# Patient Record
Sex: Female | Born: 1950 | Race: White | Hispanic: No | Marital: Married | Smoking: Never smoker
Health system: Southern US, Community
[De-identification: ages and names within clinical notes are randomized; demographics above are authoritative.]

---

## 1998-12-10 ENCOUNTER — Other Ambulatory Visit: Admission: RE | Admit: 1998-12-10 | Discharge: 1998-12-10 | Payer: Self-pay | Admitting: Obstetrics & Gynecology

## 1999-12-14 ENCOUNTER — Other Ambulatory Visit: Admission: RE | Admit: 1999-12-14 | Discharge: 1999-12-14 | Payer: Self-pay | Admitting: Obstetrics & Gynecology

## 2001-05-12 ENCOUNTER — Other Ambulatory Visit: Admission: RE | Admit: 2001-05-12 | Discharge: 2001-05-12 | Payer: Self-pay | Admitting: Obstetrics & Gynecology

## 2002-06-13 ENCOUNTER — Other Ambulatory Visit: Admission: RE | Admit: 2002-06-13 | Discharge: 2002-06-13 | Payer: Self-pay | Admitting: Obstetrics & Gynecology

## 2003-12-25 ENCOUNTER — Other Ambulatory Visit: Admission: RE | Admit: 2003-12-25 | Discharge: 2003-12-25 | Payer: Self-pay | Admitting: Obstetrics & Gynecology

## 2004-05-05 ENCOUNTER — Other Ambulatory Visit: Admission: RE | Admit: 2004-05-05 | Discharge: 2004-05-05 | Payer: Self-pay | Admitting: Obstetrics & Gynecology

## 2005-01-01 ENCOUNTER — Other Ambulatory Visit: Admission: RE | Admit: 2005-01-01 | Discharge: 2005-01-01 | Payer: Self-pay | Admitting: Obstetrics & Gynecology

## 2007-10-24 ENCOUNTER — Ambulatory Visit: Payer: Self-pay | Admitting: Cardiology

## 2007-11-01 ENCOUNTER — Encounter: Payer: Self-pay | Admitting: Cardiology

## 2007-11-01 ENCOUNTER — Ambulatory Visit: Payer: Self-pay

## 2007-11-01 ENCOUNTER — Ambulatory Visit: Payer: Self-pay | Admitting: Internal Medicine

## 2010-01-12 ENCOUNTER — Ambulatory Visit (HOSPITAL_COMMUNITY): Admission: RE | Admit: 2010-01-12 | Discharge: 2010-01-12 | Payer: Self-pay | Admitting: Family Medicine

## 2010-02-25 ENCOUNTER — Ambulatory Visit (HOSPITAL_COMMUNITY): Admission: RE | Admit: 2010-02-25 | Discharge: 2010-02-25 | Payer: Self-pay | Admitting: General Surgery

## 2010-09-11 LAB — CBC
HCT: 39.8 % (ref 36.0–46.0)
MCH: 31 pg (ref 26.0–34.0)
MCHC: 33.1 g/dL (ref 30.0–36.0)
MCV: 93.7 fL (ref 78.0–100.0)
RBC: 4.25 MIL/uL (ref 3.87–5.11)
WBC: 7.5 10*3/uL (ref 4.0–10.5)

## 2010-09-11 LAB — BASIC METABOLIC PANEL
Chloride: 104 mEq/L (ref 96–112)
Creatinine, Ser: 0.71 mg/dL (ref 0.4–1.2)
GFR calc Af Amer: >60 mL/min (ref >60)
GFR calc non Af Amer: >60 mL/min (ref >60)
Potassium: 4 mEq/L (ref 3.5–5.1)
Sodium: 138 mEq/L (ref 135–145)

## 2010-11-10 NOTE — Letter (Signed)
October 24, 2007    Gerrit Friends. Aldona Bar, M.D.  [address]  [place], [place] [address]   RE:  Karen, Callahan  MRN:  [id number]  /  DOB:  01-31-1951   Dear Dr. Karma Greaser:   Thank you for your referral of Karen Callahan.  She he is a pleasant 60-  year-old woman with a history of longstanding hypothyroidism on  Synthroid replacement.  She was noted to have skips and a somewhat  irregular heart beat which was only minimally symptomatic in discussing  things with the patient.  It was felt that she was likely having  premature ventricular complexes which seems very likely, and I note that  her TSH level was found to be low.  Her Synthroid has been subsequently  adjusted.  She is not reporting any major sense of palpitations or  exertional chest pain, and she has not had any problems with dizziness  or syncope.  In exploring things a bit further, she does state that she  has had a fairly chronic mild cough, although no frank wheezing.  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  She states that when she  walks up a flight of steps, she does have shortness of breath that is  somewhat delayed and causes of her to rest for at least a minute.  Her  resting electrocardiogram is normal.  She denies ever having any prior  diagnosis of bronchospastic disease or asthma.  Otherwise, she states  that when she exercises regularly on a treadmill or using weight  machines, she does not have any exertional chest discomfort or marked  breathlessness.  Overall, she does not seem to be very concerned about  her symptoms.  In reviewing her recent laboratory data, her TSH was  0.616 after Synthroid adjustment, BUN 17, creatinine 0.7, potassium of  4.1. Hemoglobin 13.8.   ALLERGIES:  Include PENICILLIN, SULFA DRUGS, ADVIL and MACROBID.   PRESENT MEDICATIONS:  Include Synthroid 75 mcg daily.   PAST MEDICAL HISTORY:  In  addition to that reviewed above, includes  previous history of bladder and throat cyst removal and also  endometriosis.   SOCIAL HISTORY:  The patient is married.  She has one child, a daughter  in her late 61s.  She had a son die at age 81.  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  She denies any significant  tobacco use or significant alcohol use.  She does drink caffeinated  beverages regularly, approximately 2-3 a week.   FAMILY HISTORY:  Is noncontributory for premature cardiovascular disease  or sudden cardiac death.   REVIEW OF SYSTEMS:  As outlined above.  This is essentially negative  otherwise except for seasonal allergies.   PHYSICAL EXAMINATION:  VITAL SIGNS:  Blood pressure is 134/70, heart  rate 68. Weight is 137 pounds.  GENERAL:  This is a normally nourished woman in no acute distress.  HEENT:  Conjunctiva and lids normal.  Oropharynx is clear.  NECK:  Supple.  No elevated jugular venous pressure, no loud bruits.  LUNGS:  Clear without labored breathing.  CARDIAC:  Exam reveals a regular rate and rhythm.  No S3 gallop.  No  extra heart sounds.  No loud murmur or pericardial rub.  ABDOMEN:  Without bruits.  EXTREMITIES:  Exhibit no pitting edema.  Distal pulses are 2+.  SKIN:  Otherwise warm and dry.  MUSCULOSKELETAL:  No kyphosis noted.  NEUROPSYCHIATRIC:  The patient is alert and oriented x3.  Affect seems  appropriate.   IMPRESSION AND RECOMMENDATIONS:  Description of occasional skipped  heart beats and some flutters that are brief and do not limited  activity.  Furthermore, there is no associated dizziness or syncope, and  she is not reporting any exertional chest pain.  I suspect these  palpitations are rather benign.  She may well be having premature  ventricular complexes based on their description, and I agree with your  adjustment in her Synthroid regimen.  We also talked about reducing  caffeine and other possible avenues for  testing versus observation.  After reviewing the matter, our plan is to evaluate left ventricular  function with an echocardiogram.  If her ejection fraction is normal and  she has no major valvular abnormalities, this would suggest an overall  benign process.  She is not describing symptoms particularly worrisome  for ischemia,  although I do wonder about the possibility of reactive airways disease,  possibly exercise-induced.  To this end, we will refer her for pulmonary  function tests with bronchodilator challenge to clarify the matter.  Otherwise, we will plan to contact her with the results.  If there is  further evaluation necessary, this can be arranged.    Sincerely,      Jonelle Sidle, MD  Electronically Signed    SGM/MedQ  DD: 10/24/2007  DT: 10/24/2007  Job #: [id number]

## 2010-12-11 ENCOUNTER — Ambulatory Visit: Payer: Self-pay | Admitting: Cardiology

## 2012-09-15 ENCOUNTER — Encounter: Payer: Self-pay | Admitting: *Deleted

## 2012-09-15 DIAGNOSIS — E038 Other specified hypothyroidism: Secondary | ICD-10-CM

## 2012-09-18 ENCOUNTER — Ambulatory Visit (INDEPENDENT_AMBULATORY_CARE_PROVIDER_SITE_OTHER): Payer: BC Managed Care – PPO | Admitting: Nurse Practitioner

## 2012-09-18 ENCOUNTER — Encounter: Payer: Self-pay | Admitting: Nurse Practitioner

## 2012-09-18 VITALS — BP 122/78 | Temp 98.6°F | Wt 143.8 lb

## 2012-09-18 DIAGNOSIS — E039 Hypothyroidism, unspecified: Secondary | ICD-10-CM | POA: Insufficient documentation

## 2012-09-18 DIAGNOSIS — R300 Dysuria: Secondary | ICD-10-CM

## 2012-09-18 DIAGNOSIS — Z780 Asymptomatic menopausal state: Secondary | ICD-10-CM

## 2012-09-18 LAB — POCT URINALYSIS DIPSTICK
Glucose, UA: NEGATIVE
Spec Grav, UA: 1.01

## 2012-09-19 DIAGNOSIS — Z780 Asymptomatic menopausal state: Secondary | ICD-10-CM | POA: Insufficient documentation

## 2012-09-19 DIAGNOSIS — R300 Dysuria: Secondary | ICD-10-CM | POA: Insufficient documentation

## 2012-09-19 DIAGNOSIS — N390 Urinary tract infection, site not specified: Secondary | ICD-10-CM | POA: Insufficient documentation

## 2012-09-19 NOTE — Assessment & Plan Note (Signed)
Assessment: Postmenopausal/hypoestrogenism Plan: Discussed risk associated with unopposed estrogen use. Recommended patient consider progesterone cream. Patient to discuss with her gynecologist. Complete Monistat as directed. Also continue use of Replens.

## 2012-09-19 NOTE — Assessment & Plan Note (Signed)
Assessment: Dysuria rule out UTI Plan: Will hold on antibiotics at this point. Urine sent for culture. Patient to call our office or her urologist if symptoms worsen.

## 2012-09-19 NOTE — Progress Notes (Signed)
Objective: Patient presents with complaints of slight burning with urination. Has a complex urologic history. Being followed by Dr. Wilson Singer for urology. 62 years old. Went through menopause about 4 years ago. Gets regular followup with her gynecologist. Rare use of Estrace cream. Was on a combination estrogen/progesterone HRT until one month ago. Has a followup appointment with urologist in a few months. May need cystoscopy for possible IC. Was on trimethoprim from November to February. Was on Levaquin 500 mg a day for 7 days, finish this 3 days ago. Continues to have some burning in the pelvic area. Does have a history of hypoestrogenism. No discharge. Has been using Monistat for 5 days which has helped some. Minimal pelvic discomfort. No back or flank pain. No fever. Some frequency. Married, same sexual partner. Note her mother had breast cancer at age 41, positive for estrogen receptors. Symptoms are not as severe as previously with her UTIs. Objective: NAD. Alert, oriented. Lungs clear. Heart regular rate rhythm. No CVA or flank tenderness. Abdomen soft nondistended with minimal suprapubic area tenderness. Urine is extremely dilute, urine microscopic negative. Patient defers pelvic exam.

## 2012-09-20 LAB — URINE CULTURE: Organism ID, Bacteria: NO GROWTH

## 2012-09-20 NOTE — Progress Notes (Signed)
Pt notified of results

## 2012-12-25 ENCOUNTER — Encounter: Payer: Self-pay | Admitting: Family Medicine

## 2012-12-25 ENCOUNTER — Ambulatory Visit (INDEPENDENT_AMBULATORY_CARE_PROVIDER_SITE_OTHER): Payer: BC Managed Care – PPO | Admitting: Family Medicine

## 2012-12-25 VITALS — BP 136/84 | Temp 99.2°F | Wt 143.0 lb

## 2012-12-25 DIAGNOSIS — R300 Dysuria: Secondary | ICD-10-CM

## 2012-12-25 DIAGNOSIS — N3010 Interstitial cystitis (chronic) without hematuria: Secondary | ICD-10-CM | POA: Insufficient documentation

## 2012-12-25 LAB — POCT URINALYSIS DIPSTICK

## 2012-12-25 NOTE — Progress Notes (Signed)
  Subjective:    Patient ID: Karen Callahan, female    DOB: 03-18-1951, 62 y.o.   MRN: [id number]  Urinary Tract Infection  This is a new problem. The current episode started in the past 7 days. The problem occurs every urination. The problem has been gradually worsening. The quality of the pain is described as burning. The pain is at a severity of 5/10. The pain is moderate. There has been no fever. The fever has been present for 1 - 2 days. Pertinent negatives include no chills. She has tried nothing for the symptoms. The treatment provided mild relief.  coffee and citiric acid flares it up. No obs fever. No nausea. Increased pressure. urogesic blue--calmed down symptoms Stress level calm not under a lot. Review of Systems  Constitutional: Negative for chills.  non Otherwise negative    Objective:   Physical Exam  Alert no acute distress. Lungs clear. Heart regular rate rhythm. No lumbar tenderness. No true CVA tenderness.  Urinalysis rare white blood cell no bacteria no red blood cells    Assessment & Plan:  Impression probable flare of interstitial cystitis discussed with patient. With many frequent UTIs in past patient request a culture which I think is reasonable. Culture urine. Symptomatic care discussed. WSL

## 2012-12-27 LAB — URINE CULTURE

## 2012-12-28 MED ORDER — CIPROFLOXACIN HCL 250 MG PO TABS: 250.0000 mg | ORAL_TABLET | Freq: Two times a day (BID) | ORAL | Status: AC

## 2012-12-28 NOTE — Addendum Note (Signed)
Addended by: Margaretha Sheffield on: 12/28/2012 10:55 AM   Modules accepted: Orders

## 2013-01-09 ENCOUNTER — Other Ambulatory Visit: Payer: Self-pay | Admitting: Family Medicine

## 2013-03-02 ENCOUNTER — Encounter: Payer: Self-pay | Admitting: Family Medicine

## 2013-03-02 ENCOUNTER — Ambulatory Visit (INDEPENDENT_AMBULATORY_CARE_PROVIDER_SITE_OTHER): Payer: BC Managed Care – PPO | Admitting: Family Medicine

## 2013-03-02 VITALS — BP 120/76 | Ht 65.0 in | Wt 142.0 lb

## 2013-03-02 DIAGNOSIS — F411 Generalized anxiety disorder: Secondary | ICD-10-CM

## 2013-03-02 DIAGNOSIS — E038 Other specified hypothyroidism: Secondary | ICD-10-CM

## 2013-03-02 MED ORDER — ESCITALOPRAM OXALATE 10 MG PO TABS: 10.0000 mg | ORAL_TABLET | Freq: Every day | ORAL | Status: DC

## 2013-03-02 NOTE — Progress Notes (Signed)
  Subjective:    Patient ID: Karen Callahan, female    DOB: 01-26-1951, 62 y.o.   MRN: [id number]  Anxiety Presents for initial visit. Onset was 1 to 6 months ago. The problem has been unchanged. Symptoms include palpitations and panic. Symptoms occur occasionally. The severity of symptoms is interfering with daily activities. Nothing aggravates the symptoms. The quality of sleep is poor. Nighttime awakenings: several.   There are no known risk factors. Her past medical history is significant for hyperthyroidism. Past treatments include nothing. The treatment provided no relief. Compliance with prior treatments has been good.   Sig stress with parents chronically ill and very complicated. Others in family not helping, ca worsening and hospice now involved, tho other daughter now wants aggressive rx thru [facility].   Patient experiencing spells of rapid heart rate. At times she feels short of breath and she is not sure why.  Review of Systems  Cardiovascular: Positive for palpitations.   no chest pain no abdominal pain some diminished appetite no weight loss claims no depression or minimal depression ROS otherwise negative     Objective:   Physical Exam  Alert, nad. HEENT normal. Thyroid nonpalpable. Lungs clear. Heart regular rate and rhythm. Neuro exam intact. Ankles without edema.      Assessment & Plan:  Impression #1 tachypnea #2 palpitations #3 anxiety we do not need a major cardiology and pulmonary workup currently. Patient needs intervention to help with her considerable stress. #4 hypothyroidism blood work are recheck this year and stable. Plan exercise encourage. Diet discussed. Initiate Lexapro 10 mg daily rationale discussed. Recheck as scheduled. Easily 25 minutes spent most in discussion. WSL

## 2013-03-04 DIAGNOSIS — F411 Generalized anxiety disorder: Secondary | ICD-10-CM | POA: Insufficient documentation

## 2013-03-09 ENCOUNTER — Telehealth: Payer: Self-pay | Admitting: Family Medicine

## 2013-03-09 NOTE — Telephone Encounter (Signed)
i highly doubt lexapro has caused unilateral visual changes--will need to see her eye dr.  As far as other nonspecific symptoms, she should try to to stay on the med a few more days to see if fades. If doesn't or pt declines, I never phone in changes to antidepressants. This requires separate visit to discuss pros and cons of alternatives.

## 2013-03-09 NOTE — Telephone Encounter (Signed)
Notified patient highly doubt lexapro has caused unilateral visual changes--will need to see her eye dr.  As far as other nonspecific symptoms, she should try to stay on the med a few more days to see if fades. If doesn't or pt declines, I never phone in changes to antidepressants. This requires separate visit to discuss pros and cons of alternatives. Patient verbalized understanding.

## 2013-03-09 NOTE — Telephone Encounter (Signed)
Pt states she is having side effects from her Lexapro that she has just started taking. Blurred vision local to left eye, and just don't feel right, feels unproductive. [facility]

## 2013-03-30 ENCOUNTER — Ambulatory Visit: Payer: BC Managed Care – PPO | Admitting: Family Medicine

## 2013-07-24 ENCOUNTER — Ambulatory Visit (INDEPENDENT_AMBULATORY_CARE_PROVIDER_SITE_OTHER): Payer: BC Managed Care – PPO | Admitting: Family Medicine

## 2013-07-24 ENCOUNTER — Encounter: Payer: Self-pay | Admitting: Family Medicine

## 2013-07-24 VITALS — BP 134/80 | Ht 65.0 in | Wt 143.2 lb

## 2013-07-24 DIAGNOSIS — J329 Chronic sinusitis, unspecified: Secondary | ICD-10-CM

## 2013-07-24 MED ORDER — AZITHROMYCIN 250 MG PO TABS: ORAL_TABLET | ORAL | Status: DC

## 2013-07-24 NOTE — Progress Notes (Signed)
   Subjective:    Patient ID: Arther Damesatricia P Kinnison, female    DOB: [birth year], 63 y.o.   MRN: [id number]  Fever  This is a new problem. The current episode started in the past 7 days. The maximum temperature noted was 100 to 100.9 F. Associated symptoms include congestion, coughing, headaches, muscle aches and a sore throat. Treatments tried: Zyrtec. The treatment provided no relief.      Review of Systems  Constitutional: Positive for fever.  HENT: Positive for congestion and sore throat.   Respiratory: Positive for cough.   Neurological: Positive for headaches.       Objective:   Physical Exam        Assessment & Plan:

## 2013-07-24 NOTE — Progress Notes (Signed)
   Subjective:    Patient ID: Karen Callahan, female    DOB: 02/18/1951, 63 y.o.   MRN: [id number]  HPI Bad sore throat and cough  Headache frontal drainage  Pos sore throat  fevr intermittently  Mild head ache pressure in nature  dimi energy  Nettie pot tried but not better, tried allergy med   Review of Systems Diminished energy no vomiting no diarrhea no rash ROS otherwise negative    Objective:   Physical Exam  Alert mild malaise. H&T moderate nasal congestion frontal tenderness. Pharynx some erythema neck supple. Lungs clear heart regular in rhythm.      Assessment & Plan:  Impression acute sinusitis likely with a viral element with arthralgias and myalgias of the upper extremities discussed plan Z-Pak. Symptomatic care discussed. Warning signs discussed. WSL

## 2013-11-15 ENCOUNTER — Telehealth: Payer: Self-pay | Admitting: Family Medicine

## 2013-11-15 NOTE — Telephone Encounter (Signed)
Patient needs order for blood work. °

## 2013-11-20 ENCOUNTER — Other Ambulatory Visit: Payer: Self-pay | Admitting: Nurse Practitioner

## 2013-11-20 DIAGNOSIS — Z13220 Encounter for screening for lipoid disorders: Secondary | ICD-10-CM

## 2013-11-20 DIAGNOSIS — Z0189 Encounter for other specified special examinations: Secondary | ICD-10-CM

## 2013-11-20 DIAGNOSIS — E038 Other specified hypothyroidism: Secondary | ICD-10-CM

## 2013-11-20 NOTE — Telephone Encounter (Signed)
Patient notified and verbalized understanding. 

## 2013-11-20 NOTE — Telephone Encounter (Signed)
Orders done

## 2013-11-28 ENCOUNTER — Ambulatory Visit (INDEPENDENT_AMBULATORY_CARE_PROVIDER_SITE_OTHER): Payer: BC Managed Care – PPO | Admitting: Nurse Practitioner

## 2013-11-28 VITALS — BP 118/76 | Temp 99.1°F | Ht 65.0 in | Wt 141.8 lb

## 2013-11-28 DIAGNOSIS — N3010 Interstitial cystitis (chronic) without hematuria: Secondary | ICD-10-CM

## 2013-11-28 DIAGNOSIS — N390 Urinary tract infection, site not specified: Secondary | ICD-10-CM

## 2013-11-28 DIAGNOSIS — R300 Dysuria: Secondary | ICD-10-CM

## 2013-11-28 LAB — POCT URINALYSIS DIPSTICK
RBC UA: 50
pH, UA: 5

## 2013-11-28 NOTE — Patient Instructions (Signed)
Elmiron vulvodynia

## 2013-11-29 ENCOUNTER — Encounter: Payer: Self-pay | Admitting: Nurse Practitioner

## 2013-11-29 LAB — POCT UA - MICROSCOPIC ONLY
BACTERIA, U MICROSCOPIC: NEGATIVE
RBC, URINE, MICROSCOPIC: NEGATIVE
WBC, UR, HPF, POC: NEGATIVE

## 2013-11-29 NOTE — Progress Notes (Signed)
Subjective:  Presents requesting a check of her urine. No change in her symptomatology, chronic dysuria. Has a stomach virus 4 days ago with vomiting and diarrhea with max temp 102.5. This has resolved but patient trying to get her energy and appetite back to normal. Has held off on her labs due to this.   Objective:   BP 118/76  Temp(Src) 99.1 F (37.3 C) (Oral)  Ht 5\' 5"  (1.651 m)  Wt 141 lb 12.8 oz (64.32 kg)  BMI 23.60 kg/m2 NAD. Alert, oriented. Lungs clear. Heart RRR. No CVA tenderness. abd soft, non distended, non tender.  Results for orders placed in visit on 11/28/13  POCT URINALYSIS DIPSTICK      Result Value Ref Range   Color, UA       Clarity, UA       Glucose, UA       Bilirubin, UA ++     Ketones, UA       Spec Grav, UA >=1.030     Blood, UA 50     pH, UA 5.0     Protein, UA       Urobilinogen, UA       Nitrite, UA       Leukocytes, UA      POCT UA - MICROSCOPIC ONLY      Result Value Ref Range   WBC, Ur, HPF, POC neg     RBC, urine, microscopic neg     Bacteria, U Microscopic neg     Mucus, UA       Epithelial cells, urine per micros       Crystals, Ur, HPF, POC       Casts, Ur, LPF, POC       Yeast, UA         Assessment:  Problem List Items Addressed This Visit     Genitourinary   Recurrent UTI   Relevant Medications      trimethoprim (TRIMPEX) 100 MG tablet   Interstitial cystitis     Other   Dysuria - Primary   Relevant Orders      POCT urinalysis dipstick (Completed)      POCT UA - Microscopic Only      Urine culture     Plan: urine culture pending. Call back sooner if symptoms worsen. Continue clear fluid intake.

## 2013-11-30 LAB — URINE CULTURE
Colony Count: NO GROWTH
ORGANISM ID, BACTERIA: NO GROWTH

## 2013-12-10 LAB — CBC WITH DIFFERENTIAL/PLATELET
BASOS ABS: 0 10*3/uL (ref 0.0–0.1)
Basophils Relative: 0 % (ref 0–1)
Eosinophils Absolute: 0.1 10*3/uL (ref 0.0–0.7)
Eosinophils Relative: 2 % (ref 0–5)
HEMATOCRIT: 36.8 % (ref 36.0–46.0)
HEMOGLOBIN: 13 g/dL (ref 12.0–15.0)
LYMPHS PCT: 39 % (ref 12–46)
Lymphs Abs: 2.1 10*3/uL (ref 0.7–4.0)
MCH: 30.7 pg (ref 26.0–34.0)
MCHC: 35.3 g/dL (ref 30.0–36.0)
MCV: 87 fL (ref 78.0–100.0)
MONOS PCT: 8 % (ref 3–12)
Monocytes Absolute: 0.4 10*3/uL (ref 0.1–1.0)
Neutro Abs: 2.8 10*3/uL (ref 1.7–7.7)
Neutrophils Relative %: 51 % (ref 43–77)
Platelets: 318 10*3/uL (ref 150–400)
RBC: 4.23 MIL/uL (ref 3.87–5.11)
RDW: 14.4 % (ref 11.5–15.5)
WBC: 5.4 10*3/uL (ref 4.0–10.5)

## 2013-12-10 LAB — BASIC METABOLIC PANEL
BUN: 16 mg/dL (ref 6–23)
CALCIUM: 9.7 mg/dL (ref 8.4–10.5)
CHLORIDE: 108 meq/L (ref 96–112)
CO2: 27 meq/L (ref 19–32)
Creat: 0.73 mg/dL (ref 0.50–1.10)
GLUCOSE: 92 mg/dL (ref 70–99)
Potassium: 4.6 mEq/L (ref 3.5–5.3)
Sodium: 142 mEq/L (ref 135–145)

## 2013-12-10 LAB — TSH: TSH: 2.232 u[IU]/mL (ref 0.350–4.500)

## 2013-12-10 LAB — HEPATIC FUNCTION PANEL
ALT: 12 U/L (ref 0–35)
AST: 16 U/L (ref 0–37)
Albumin: 4.3 g/dL (ref 3.5–5.2)
Alkaline Phosphatase: 55 U/L (ref 39–117)
Bilirubin, Direct: 0.2 mg/dL (ref 0.0–0.3)
Indirect Bilirubin: 0.6 mg/dL (ref 0.2–1.2)
Total Bilirubin: 0.8 mg/dL (ref 0.2–1.2)
Total Protein: 6.7 g/dL (ref 6.0–8.3)

## 2013-12-10 LAB — LIPID PANEL
Cholesterol: 220 mg/dL — ABNORMAL HIGH (ref 0–200)
HDL: 55 mg/dL (ref >39)
LDL Cholesterol: 153 mg/dL — ABNORMAL HIGH (ref 0–99)
Total CHOL/HDL Ratio: 4 Ratio
Triglycerides: 60 mg/dL (ref <150)
VLDL: 12 mg/dL (ref 0–40)

## 2013-12-11 ENCOUNTER — Other Ambulatory Visit: Payer: Self-pay | Admitting: *Deleted

## 2013-12-11 LAB — VITAMIN D 25 HYDROXY (VIT D DEFICIENCY, FRACTURES): VIT D 25 HYDROXY: 41 ng/mL (ref 30–89)

## 2013-12-11 MED ORDER — LEVOTHYROXINE SODIUM 75 MCG PO TABS: 75.0000 ug | ORAL_TABLET | Freq: Every day | ORAL | Status: DC

## 2014-01-25 ENCOUNTER — Ambulatory Visit (INDEPENDENT_AMBULATORY_CARE_PROVIDER_SITE_OTHER): Payer: BC Managed Care – PPO | Admitting: Urology

## 2014-01-25 DIAGNOSIS — N3020 Other chronic cystitis without hematuria: Secondary | ICD-10-CM

## 2014-01-25 DIAGNOSIS — N3010 Interstitial cystitis (chronic) without hematuria: Secondary | ICD-10-CM

## 2014-01-25 DIAGNOSIS — N952 Postmenopausal atrophic vaginitis: Secondary | ICD-10-CM

## 2014-01-25 DIAGNOSIS — N343 Urethral syndrome, unspecified: Secondary | ICD-10-CM

## 2014-04-12 ENCOUNTER — Other Ambulatory Visit: Payer: Self-pay

## 2014-07-25 ENCOUNTER — Other Ambulatory Visit: Payer: Self-pay | Admitting: Family Medicine

## 2014-07-26 ENCOUNTER — Telehealth: Payer: Self-pay | Admitting: Family Medicine

## 2014-07-26 DIAGNOSIS — Z13220 Encounter for screening for lipoid disorders: Secondary | ICD-10-CM

## 2014-07-26 DIAGNOSIS — E039 Hypothyroidism, unspecified: Secondary | ICD-10-CM

## 2014-07-26 NOTE — Telephone Encounter (Signed)
Pt needs bw orders to include thyroid check   Please call pt when sent

## 2014-07-26 NOTE — Telephone Encounter (Signed)
Blood work ordered in Epic. Patient notified. 

## 2014-07-26 NOTE — Telephone Encounter (Signed)
Lip tsh 

## 2014-07-31 LAB — LIPID PANEL
CHOL/HDL RATIO: 4.1 ratio
Cholesterol: 254 mg/dL — ABNORMAL HIGH (ref 0–200)
HDL: 62 mg/dL (ref >39)
LDL CALC: 174 mg/dL — AB (ref 0–99)
TRIGLYCERIDES: 88 mg/dL (ref <150)
VLDL: 18 mg/dL (ref 0–40)

## 2014-07-31 LAB — TSH: TSH: 2.327 u[IU]/mL (ref 0.350–4.500)

## 2014-08-02 ENCOUNTER — Ambulatory Visit (INDEPENDENT_AMBULATORY_CARE_PROVIDER_SITE_OTHER): Payer: BC Managed Care – PPO | Admitting: Family Medicine

## 2014-08-02 ENCOUNTER — Encounter: Payer: Self-pay | Admitting: Family Medicine

## 2014-08-02 VITALS — BP 128/72 | Ht 65.0 in | Wt 149.0 lb

## 2014-08-02 DIAGNOSIS — E039 Hypothyroidism, unspecified: Secondary | ICD-10-CM

## 2014-08-02 DIAGNOSIS — H8110 Benign paroxysmal vertigo, unspecified ear: Secondary | ICD-10-CM

## 2014-08-02 DIAGNOSIS — E785 Hyperlipidemia, unspecified: Secondary | ICD-10-CM

## 2014-08-02 MED ORDER — LEVOTHYROXINE SODIUM 100 MCG PO TABS: 100.0000 ug | ORAL_TABLET | Freq: Every day | ORAL | Status: DC

## 2014-08-02 NOTE — Progress Notes (Signed)
   Subjective:    Patient ID: Karen Callahan, female    DOB: 17-Apr-1951, 64 y.o.   MRN: [id number]  HPI Patient arrives for a follow up on thyroid. Patient would like to discuss levels and her recent cholesterol results.   Patient also having vertigo and would like ears checked. Off and on room spins some. Occurs with laying down in bed . Had also hits for twenty seconds wihen it kicks in .  Results for orders placed or performed in visit on 07/26/14  Lipid panel  Result Value Ref Range   Cholesterol 254 (H) 0 - 200 mg/dL   Triglycerides 88 <409<150 mg/dL   HDL 62 >81>39 mg/dL   Total CHOL/HDL Ratio 4.1 Ratio   VLDL 18 0 - 40 mg/dL   LDL Cholesterol 191174 (H) 0 - 99 mg/dL  TSH  Result Value Ref Range   TSH 2.327 0.350 - 4.500 uIU/mL   Had decr thyr in the past because of the tendency towards palpitations. constip at times, wonders if lthyr med may be contributing.  So so work on H. [employer]. Heinzchol diet these days. Exercising some but not as much as she needs to.  [place] integrated  copliant with thyr med. Does not miss a dose. No significant fatigue. Wonders if she could get her TSH lower intake her prior dose. Also wonders about Armour Thyroid versus levothyroid versus Synthroid Review of Systems No headache no chest pain no back pain abdominal pain no change in bowel habits    Objective:   Physical Exam  Alert vitals stable. HEENT normal neck supple lungs clear her thyroid nonpalpable heart regular in rhythm.      Assessment & Plan:  Impression 1 hypothyroidism apparent good control patient would like to try to get a bit tighter. Requests going back to her prior dose. #2 multiple questions asked about thyroid wonder whether she should go to a "integrative" thyroid specialist. Offered to patient would be happy to set her up if she desires #3 vertigo intermittent discussed #4 hyperlipidemia discussed plan patient work harder on diet. Increase levothyroid is discussed. PT referral for  Epley's maneuvers to help with vertigo and in her ear dysfunction. WSL

## 2014-08-04 DIAGNOSIS — H8110 Benign paroxysmal vertigo, unspecified ear: Secondary | ICD-10-CM | POA: Insufficient documentation

## 2014-08-04 DIAGNOSIS — E785 Hyperlipidemia, unspecified: Secondary | ICD-10-CM | POA: Insufficient documentation

## 2014-08-14 ENCOUNTER — Telehealth: Payer: Self-pay | Admitting: Family Medicine

## 2014-08-14 DIAGNOSIS — E039 Hypothyroidism, unspecified: Secondary | ICD-10-CM

## 2014-08-14 NOTE — Telephone Encounter (Signed)
Patient would like a referral to Dr. [place]arc[place]ter [place] on her thyroid .

## 2014-08-14 NOTE — Telephone Encounter (Signed)
Let's do, pt wants endocrin to take over

## 2014-08-15 ENCOUNTER — Ambulatory Visit (HOSPITAL_COMMUNITY): Payer: BC Managed Care – PPO | Admitting: Physical Therapy

## 2014-08-15 NOTE — Addendum Note (Signed)
Addended byOneal Deputy: Grayling Schranz D on: 08/15/2014 11:40 AM   Modules accepted: Orders

## 2014-08-15 NOTE — Telephone Encounter (Signed)
Notified patient via VM stating we sent in the referral for endo.

## 2014-08-19 ENCOUNTER — Ambulatory Visit (HOSPITAL_COMMUNITY): Payer: BC Managed Care – PPO | Attending: Family Medicine | Admitting: Physical Therapy

## 2014-08-19 ENCOUNTER — Ambulatory Visit (HOSPITAL_COMMUNITY): Payer: BC Managed Care – PPO | Admitting: Physical Therapy

## 2014-08-19 DIAGNOSIS — H8112 Benign paroxysmal vertigo, left ear: Secondary | ICD-10-CM | POA: Diagnosis not present

## 2014-08-19 DIAGNOSIS — R42 Dizziness and giddiness: Secondary | ICD-10-CM | POA: Insufficient documentation

## 2014-08-19 NOTE — Therapy (Signed)
[facility] [address] [place], [place], [address] Phone: [phone]   Fax:  [phone]  Physical Therapy Evaluation  Patient Details  Name: Karen Callahan MRN: [id number] Date of Birth: 07-07-1950 Referring Provider:  Merlyn Albert, MD  Encounter Date: 08/19/2014      PT End of Session - 08/19/14 1244    Visit Number 1   Number of Visits 4   Date for PT Re-Evaluation 09/18/14   Authorization Type [employer]   Authorization - Visit Number 1   Authorization - Number of Visits 4   PT Start Time 1015   PT Stop Time 1100   PT Time Calculation (min) 45 min   Activity Tolerance Patient tolerated treatment well   Behavior During Therapy [facility] for tasks assessed/performed      No past medical history on file.  No past surgical history on file.  There were no vitals taken for this visit.  Visit Diagnosis:  Benign paroxysmal positional vertigo, left      Subjective Assessment - 08/19/14 1020    Symptoms dizziness with chanign positions. Can cause nausea. nop neck and shoulder pain. patient is otherwise healthy.    Pertinent History Patient has a history of BPPV in family, towo members. No prior treatment. Patient becomes increasing ly dizzy with bending forward, laying down, changing positions, intermittently, Patient notes occasional vertigo from looking at phone. Patient is seeing an optomatrist this afternoon to cheeck vision.    Currently in Pain? No/denies   Pain Score 10-Worst pain ever  at worst making apatient feel like she is going to fall.    Aggravating Factors  changing positions. Everythign moves to the right can be severe causing her to feel like she is going to fall           [facility] PT Assessment - 08/19/14 0001    Assessment   Medical Diagnosis BPPV, vertigo   Onset Date 04/18/14   Next MD Visit [place]   Prior Therapy no   Balance Screen   Has the patient fallen in the past 6 months No   Has the patient  had a decrease in activity level because of a fear of falling?  No   Is the patient reluctant to leave their home because of a fear of falling?  No   Prior Function   Level of Independence Independent with basic ADLs   Other:   Other/ Comments Halpike dix test: positive Lt ear BPPV   Other:   Other/Comments Epley manuever decreased Dizzines when going to Rt.             PT Education - 08/19/14 1243    Education provided Yes   Education Details Self Epley Manuever from Lt to Rt, diagnosis, prognoasis and visual tracking exercises.    Person(s) Educated Patient   Methods Explanation;Demonstration;Handout;Tactile cues;Verbal cues   Comprehension Verbalized understanding;Returned demonstration          PT Short Term Goals - 08/19/14 1249    PT SHORT TERM GOAL #1   Title Patient will stater symptoms of verigo reduced 50%   Time 2   Period Weeks   Status New           PT Long Term Goals - 08/19/14 1249    PT LONG TERM GOAL #1   Title Patient will state independence with HEP   Time 4   Period Weeks   Status New   PT LONG TERM GOAL #2  Title Patient will state vertigo symptoms present <10%   Time 4   Period Weeks   Status New           Plan - 08/19/14 1244    Clinical Impression Statement Patient displays sings and symptoms consistent with benign paroxysomal Positional vertigo resulting in dizziness and fatigue including eye pursuits to Rt with head rotation and increased dizziness brough out with turning of head. Following [employer] dix test patient had positive signs for Lt inner hear BPPV that was resolved follwoing Epley manuever from Lt to Rt.    Pt will benefit from skilled therapeutic intervention in order to improve on the following deficits Other (comment)  Dizziness   Rehab Potential Good   PT Frequency 1x / week   PT Duration 4 weeks   PT Treatment/Interventions Therapeutic exercise;Manual techniques   PT Next Visit Plan Patient to return in 1 to 2  weeks for reassessment to and progression of visual tracking exercises.    PT Home Exercise Plan Self epley manuever.    Consulted and Agree with Plan of Care Patient         Problem List Patient Active Problem List   Diagnosis Date Noted  . Hyperlipidemia LDL goal <130 08/04/2014  . Benign paroxysmal positional vertigo 08/04/2014  . Generalized anxiety disorder 03/04/2013  . Interstitial cystitis 12/25/2012  . Dysuria 09/19/2012  . Postmenopausal estrogen deficiency 09/19/2012  . Recurrent UTI 09/19/2012  . Hypothyroidism 09/18/2012   Jerilee Fieldash Mychele Seyller PT DPT [phone]  [facility] [address] [place], [place], [address] Phone: [phone]   Fax:  [phone]

## 2014-08-19 NOTE — Patient Instructions (Signed)
Epley Maneuver Self-Care WHAT IS THE EPLEY MANEUVER? The Epley maneuver is an exercise you can do to relieve symptoms of benign paroxysmal positional vertigo (BPPV). This condition is often just referred to as vertigo. BPPV is caused by the movement of tiny crystals (canaliths) inside your inner ear. The accumulation and movement of canaliths in your inner ear causes a sudden spinning sensation (vertigo) when you move your head to certain positions. Vertigo usually lasts about 30 seconds. BPPV usually occurs in just one ear. If you get vertigo when you lie on your left side, you probably have BPPV in your left ear. Your health care provider can tell you which ear is involved.  BPPV may be caused by a head injury. Many people older than 50 get BPPV for unknown reasons. If you have been diagnosed with BPPV, your health care provider may teach you how to do this maneuver. BPPV is not life threatening (benign) and usually goes away in time.  WHEN SHOULD I PERFORM THE EPLEY MANEUVER? You can do this maneuver at home whenever you have symptoms of vertigo. You may do the Epley maneuver up to 3 times a day until your symptoms of vertigo go away. HOW SHOULD I DO THE EPLEY MANEUVER? 1. Sit on the edge of a bed or table with your back straight. Your legs should be extended or hanging over the edge of the bed or table.  2. Turn your head halfway toward the affected ear (Left).  3. Lie backward quickly with your head turned until you are lying flat on your back. You may want to position a pillow under your shoulders.  4. Hold this position for 30 seconds. You may experience an attack of vertigo. This is normal. Hold this position until the vertigo stops. 5. Then turn your head to the opposite direction until your unaffected ear is facing the floor.  6. Hold this position for 30 seconds. You may experience an attack of vertigo. This is normal. Hold this position until the vertigo stops. 7. Now turn your whole  body to the same side as your head. Hold for another 30 seconds.  8. You can then sit back up. ARE THERE RISKS TO THIS MANEUVER? In some cases, you may have other symptoms (such as changes in your vision, weakness, or numbness). If you have these symptoms, stop doing the maneuver and call your health care provider. Even if doing these maneuvers relieves your vertigo, you may still have dizziness. Dizziness is the sensation of light-headedness but without the sensation of movement. Even though the Epley maneuver may relieve your vertigo, it is possible that your symptoms will return within 5 years. WHAT SHOULD I DO AFTER THIS MANEUVER? After doing the Epley maneuver, you can return to your normal activities. Ask your doctor if there is anything you should do at home to prevent vertigo. This may include:  Sleeping with two or more pillows to keep your head elevated.  Not sleeping on the side of your affected ear.  Getting up slowly from bed.  Avoiding sudden movements during the day.  Avoiding extreme head movement, like looking up or bending over.  Wearing a cervical collar to prevent sudden head movements. WHAT SHOULD I DO IF MY SYMPTOMS GET WORSE? Call your health care provider if your vertigo gets worse. Call your provider right way if you have other symptoms, including:   Nausea.  Vomiting.  Headache.  Weakness.  Numbness.  Vision changes. Document Released: 06/19/2013 Document Reviewed: 06/19/2013  ExitCare Patient Information 2015 [employer]. This information is not intended to replace advice given to you by your health care provider. Make sure you discuss any questions you have with your health care provider.  

## 2014-08-23 ENCOUNTER — Telehealth: Payer: Self-pay | Admitting: *Deleted

## 2014-08-23 MED ORDER — CEFPROZIL 500 MG PO TABS: 500.0000 mg | ORAL_TABLET | Freq: Two times a day (BID) | ORAL | Status: DC

## 2014-08-23 NOTE — Telephone Encounter (Signed)
Pt would like something called in for UTI. Sent in Cefzil per Dr. Lorin PicketScott. No fever, no flank pain, just dysuria. Do not take Trimpex while taking cefzil. Pt verbalized understanding.

## 2014-09-02 ENCOUNTER — Encounter (HOSPITAL_COMMUNITY): Payer: BC Managed Care – PPO | Admitting: Physical Therapy

## 2014-09-03 ENCOUNTER — Encounter (HOSPITAL_COMMUNITY): Payer: BC Managed Care – PPO | Admitting: Physical Therapy

## 2014-09-04 ENCOUNTER — Ambulatory Visit (INDEPENDENT_AMBULATORY_CARE_PROVIDER_SITE_OTHER): Payer: BC Managed Care – PPO | Admitting: Nurse Practitioner

## 2014-09-04 ENCOUNTER — Encounter: Payer: Self-pay | Admitting: Nurse Practitioner

## 2014-09-04 VITALS — BP 124/76 | Temp 98.7°F | Ht 65.0 in | Wt 144.2 lb

## 2014-09-04 DIAGNOSIS — N390 Urinary tract infection, site not specified: Secondary | ICD-10-CM | POA: Diagnosis not present

## 2014-09-04 DIAGNOSIS — N3289 Other specified disorders of bladder: Secondary | ICD-10-CM

## 2014-09-04 DIAGNOSIS — Z780 Asymptomatic menopausal state: Secondary | ICD-10-CM | POA: Diagnosis not present

## 2014-09-04 DIAGNOSIS — R300 Dysuria: Secondary | ICD-10-CM | POA: Diagnosis not present

## 2014-09-04 DIAGNOSIS — R1031 Right lower quadrant pain: Secondary | ICD-10-CM | POA: Diagnosis not present

## 2014-09-04 LAB — POCT URINALYSIS DIPSTICK
Spec Grav, UA: 1.02
pH, UA: 5

## 2014-09-04 MED ORDER — BACLOFEN 10 MG PO TABS: 10.0000 mg | ORAL_TABLET | Freq: Three times a day (TID) | ORAL | Status: DC

## 2014-09-04 NOTE — Patient Instructions (Signed)
Miralax as directed

## 2014-09-05 ENCOUNTER — Encounter: Payer: Self-pay | Admitting: Nurse Practitioner

## 2014-09-05 LAB — BASIC METABOLIC PANEL
BUN/Creatinine Ratio: 22 (ref 11–26)
BUN: 17 mg/dL (ref 8–27)
CO2: 25 mmol/L (ref 18–29)
Calcium: 9.6 mg/dL (ref 8.7–10.3)
Chloride: 102 mmol/L (ref 97–108)
Creatinine, Ser: 0.79 mg/dL (ref 0.57–1.00)
GFR calc Af Amer: 93 mL/min/{1.73_m2} (ref >59)
GFR, EST NON AFRICAN AMERICAN: 81 mL/min/{1.73_m2} (ref >59)
GLUCOSE: 94 mg/dL (ref 65–99)
Potassium: 3.9 mmol/L (ref 3.5–5.2)
SODIUM: 142 mmol/L (ref 134–144)

## 2014-09-05 NOTE — Progress Notes (Signed)
Subjective:  Presents for c/o pain mainly at the end or urination. Severe at times. No relief with AZO. Has a complex urologic history including recurrent UTIs. Concerned about use of excessive antibiotics. Has chronic bulging in the RLQ now with pain going into right pelvic area. Sees gynecologist; has appt near future. Pressure in pelvic area especially with urination. No fever. Has problems with constipation; no blood in stools. Post menopausal.   Objective:   BP 124/76 mmHg  Temp(Src) 98.7 F (37.1 C) (Oral)  Ht 5\' 5"  (1.651 m)  Wt 144 lb 4 oz (65.431 kg)  BMI 24.00 kg/m2 NAD. Alert, oriented. Lungs clear. Heart RRR. No CVA or flank tenderness. Abdomen soft, non distended with active BS; no masses or bulging supine or with standing and valsalva. Tenderness in the mid right quad, slightly into the RLQ/right pelvic area. UA neg.   Assessment:  Problem List Items Addressed This Visit      Genitourinary   Recurrent UTI     Other   Postmenopausal estrogen deficiency (Chronic)   Dysuria - Primary   Relevant Orders   POCT urinalysis dipstick (Completed)   Urine culture    Other Visit Diagnoses    RLQ abdominal pain        Relevant Orders    CT Abdomen Pelvis W Contrast    Basic metabolic panel (Completed)    Bladder spasms           Plan:  Meds ordered this encounter  Medications  . levothyroxine (SYNTHROID, LEVOTHROID) 88 MCG tablet    Sig: Take 88 mcg by mouth daily before breakfast.  . baclofen (LIORESAL) 10 MG tablet    Sig: Take 1 tablet (10 mg total) by mouth 3 (three) times daily. Prn spasms    Dispense:  30 each    Refill:  0    Order Specific Question:  Supervising Provider    Answer:  Merlyn AlbertLUKING, WILLIAM S [[id number]]   No antibiotics today. Urine culture pending. Patient plans to seek second opinion from another urologist. Also, recommend pelvic exam at gyn to assess for abnormalities.  Return if symptoms worsen or fail to improve.

## 2014-09-06 LAB — URINE CULTURE

## 2014-09-09 ENCOUNTER — Ambulatory Visit (HOSPITAL_COMMUNITY)
Admission: RE | Admit: 2014-09-09 | Discharge: 2014-09-09 | Disposition: A | Discharge status: Home / Self Care | Payer: BC Managed Care – PPO | Source: Ambulatory Visit | Attending: Nurse Practitioner | Admitting: Nurse Practitioner

## 2014-09-09 ENCOUNTER — Encounter (HOSPITAL_COMMUNITY): Payer: Self-pay

## 2014-09-09 DIAGNOSIS — K5790 Diverticulosis of intestine, part unspecified, without perforation or abscess without bleeding: Secondary | ICD-10-CM | POA: Insufficient documentation

## 2014-09-09 DIAGNOSIS — R1031 Right lower quadrant pain: Secondary | ICD-10-CM | POA: Insufficient documentation

## 2014-09-09 DIAGNOSIS — R934 Abnormal findings on diagnostic imaging of urinary organs: Secondary | ICD-10-CM | POA: Insufficient documentation

## 2014-09-09 DIAGNOSIS — Z87440 Personal history of urinary (tract) infections: Secondary | ICD-10-CM | POA: Insufficient documentation

## 2014-09-09 DIAGNOSIS — K8020 Calculus of gallbladder without cholecystitis without obstruction: Secondary | ICD-10-CM | POA: Diagnosis not present

## 2014-09-09 MED ORDER — IOHEXOL 300 MG/ML  SOLN
100.0000 mL | Freq: Once | INTRAMUSCULAR | Status: AC | PRN
  Administered 2014-09-09: 100 mL via INTRAVENOUS

## 2014-09-16 ENCOUNTER — Encounter: Payer: Self-pay | Admitting: Family Medicine

## 2014-09-16 ENCOUNTER — Ambulatory Visit (INDEPENDENT_AMBULATORY_CARE_PROVIDER_SITE_OTHER): Payer: BC Managed Care – PPO | Admitting: Family Medicine

## 2014-09-16 VITALS — BP 136/90 | Temp 98.3°F | Ht 65.0 in | Wt 143.0 lb

## 2014-09-16 DIAGNOSIS — R300 Dysuria: Secondary | ICD-10-CM | POA: Diagnosis not present

## 2014-09-16 DIAGNOSIS — N3000 Acute cystitis without hematuria: Secondary | ICD-10-CM | POA: Diagnosis not present

## 2014-09-16 LAB — POCT URINALYSIS DIPSTICK
Blood, UA: POSITIVE
pH, UA: 6

## 2014-09-16 MED ORDER — CEFDINIR 300 MG PO CAPS: 300.0000 mg | ORAL_CAPSULE | Freq: Two times a day (BID) | ORAL | Status: DC

## 2014-09-16 NOTE — Progress Notes (Signed)
   Subjective:    Patient ID: Karen Callahan, female    DOB: 1950-12-26, 64 y.o.   MRN: [id number]  Dysuria  This is a recurrent problem. Episode onset: This morning. The problem occurs every urination. The problem has been gradually worsening. The quality of the pain is described as burning. There has been no fever. Associated symptoms comments: spasms . She has tried increased fluids and acetaminophen (Baclofen) for the symptoms. The treatment provided mild relief. Her past medical history is significant for recurrent UTIs.   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   no high fevers no headache no chest pain no vomiting     Objective:   Physical Exam Alert vitals stable. No apparent distress. Lungs clear. Heart rare rhythm no [place]  Urinalysis 10-15 white blood cells per high-power field       Assessment & Plan:  Impression probable urinary tract infection with complicating features including history of recurrent urethral procedures, apparent urethral spasm, and question of interstitial cystitis component plan Omnicef twice a day 7 days. Culture urine. Press on with urology referral as already initiated per patient

## 2014-09-18 LAB — URINE CULTURE

## 2014-10-11 ENCOUNTER — Encounter (HOSPITAL_COMMUNITY): Payer: Self-pay | Admitting: Physical Therapy

## 2014-10-11 NOTE — Therapy (Signed)
[place], [place], [address] Phone: [phone]   Fax:  [phone]  Patient Details  Name: LORETTE PETERKIN MRN: [id number] Date of Birth: 10-01-1950 Referring Provider:  No ref. provider found  Encounter Date: 10/11/2014  PHYSICAL THERAPY DISCHARGE SUMMARY  Visits from Start of Care: 1 of 4  Current functional level related to goals / functional outcomes: PT Short Term Goals - 08/19/14 1249    PT SHORT TERM GOAL #1   Title Patient will stater symptoms of verigo reduced 50%   Time 2   Period Weeks   Status unknown           PT Long Term Goals - 08/19/14 1249    PT LONG TERM GOAL #1   Title Patient will state independence with HEP   Time 4   Period Weeks   Status Unknown   PT LONG TERM GOAL #2   Title Patient will state vertigo symptoms present <10%   Time 4   Period Weeks   Status Unknown         Symptoms were relieved during lest session.  Plan: Patient agrees to discharge.  Patient goals were partially met. Patient is being discharged due to not returning since the last visit.  ?????       Devona Konig PT DPT [facility] [address] [place], [place], [address] Phone: [phone]   Fax:  [phone]

## 2014-12-10 ENCOUNTER — Telehealth: Payer: Self-pay | Admitting: Family Medicine

## 2014-12-10 MED ORDER — PREDNISONE 20 MG PO TABS: ORAL_TABLET | ORAL | Status: DC

## 2014-12-10 NOTE — Telephone Encounter (Signed)
Adult pred taper 

## 2014-12-10 NOTE — Telephone Encounter (Signed)
Pt has poison oak on her back and would like for something to be called in.   [facility]

## 2014-12-10 NOTE — Telephone Encounter (Signed)
Patient was notified that medication has been sent to the pharmacy.

## 2014-12-19 ENCOUNTER — Ambulatory Visit (INDEPENDENT_AMBULATORY_CARE_PROVIDER_SITE_OTHER): Payer: BC Managed Care – PPO | Admitting: Family Medicine

## 2014-12-19 ENCOUNTER — Encounter: Payer: Self-pay | Admitting: Family Medicine

## 2014-12-19 VITALS — BP 120/74 | Temp 98.3°F | Ht 65.0 in | Wt 137.0 lb

## 2014-12-19 DIAGNOSIS — R21 Rash and other nonspecific skin eruption: Secondary | ICD-10-CM | POA: Diagnosis not present

## 2014-12-19 MED ORDER — TRIAMCINOLONE ACETONIDE 0.1 % EX CREA: 1.0000 "application " | TOPICAL_CREAM | Freq: Two times a day (BID) | CUTANEOUS | Status: DC

## 2014-12-19 NOTE — Progress Notes (Signed)
   Subjective:    Patient ID: Karen Callahan, female    DOB: Nov 08, 1950, 64 y.o.   MRN: [id number]  Rash This is a new problem. The current episode started 1 to 4 weeks ago. The problem is unchanged. The affected locations include the back. The rash is characterized by itchiness and burning. She was exposed to nothing. Past treatments include oral steroids and anti-itch cream. The treatment provided no relief.   Patient states that she has no other concerns at this time.  Patient notes that historically this started as a cluster of blisters Place on the right ankle itchy and splotchy and oozed   Another rash  ques exp   Noticed no sig changed with the rash    Patient also experienced tenderness and soreness in same region.  Review of Systems  Skin: Positive for rash.   No fever no chills    Objective:   Physical Exam  Alert vital stable lungs clear heart rare rhythm left posterior thorax reveals a cluster of partially healed lesions. Some nodular component.      Assessment & Plan:  Impression resolving shingles discussed at great length plan recommend shingles vaccine. Triamcinolone cream twice a day to affected area. Minimal neuropathic pain fortunately follow-up as needed WSL

## 2016-05-13 ENCOUNTER — Ambulatory Visit (HOSPITAL_COMMUNITY)
Admission: RE | Admit: 2016-05-13 | Discharge: 2016-05-13 | Disposition: A | Discharge status: Home / Self Care | Payer: BC Managed Care – PPO | Source: Ambulatory Visit | Attending: Nurse Practitioner | Admitting: Nurse Practitioner

## 2016-05-13 ENCOUNTER — Encounter: Payer: Self-pay | Admitting: Nurse Practitioner

## 2016-05-13 ENCOUNTER — Ambulatory Visit (INDEPENDENT_AMBULATORY_CARE_PROVIDER_SITE_OTHER): Payer: BC Managed Care – PPO | Admitting: Nurse Practitioner

## 2016-05-13 VITALS — BP 130/78 | Ht 65.0 in | Wt 149.4 lb

## 2016-05-13 DIAGNOSIS — R5383 Other fatigue: Secondary | ICD-10-CM

## 2016-05-13 DIAGNOSIS — R590 Localized enlarged lymph nodes: Secondary | ICD-10-CM | POA: Insufficient documentation

## 2016-05-13 DIAGNOSIS — E039 Hypothyroidism, unspecified: Secondary | ICD-10-CM

## 2016-05-13 DIAGNOSIS — J449 Chronic obstructive pulmonary disease, unspecified: Secondary | ICD-10-CM | POA: Diagnosis not present

## 2016-05-13 DIAGNOSIS — E785 Hyperlipidemia, unspecified: Secondary | ICD-10-CM

## 2016-05-13 NOTE — Progress Notes (Signed)
Subjective:  Presents for routine follow up. Compliant with thyroid med. At recent gyn appointment, doctor noticed an enlargement on the left side of the neck. No fever, unexplained weight loss, no fatigue. No chest pain/ischemic type pain or shortness of breath. Nonsmoker.  Objective:   BP 130/78   Ht 5\' 5"  (1.651 m)   Wt 149 lb 6.4 oz (67.8 kg)   BMI 24.86 kg/m  NAD. Alert, oriented. Lungs clear. Heart regular rate rhythm. Extremely tight tender muscles noted on the lateral neck area with tightness and distention on the left side. One small lymph node noted at the base just above the supraclavicular area, question whether this is an anterior cervical lymph node or supraclavicular. Extremely tight tender muscles noted in the upper back area along the trapezius. Thyroid slightly nodular texture but no mass or goiter. Nontender to palpation.  Assessment:  Problem List Items Addressed This Visit      Endocrine   Hypothyroidism - Primary (Chronic)   Relevant Orders   TSH     Other   Hyperlipidemia LDL goal <130   Relevant Orders   Lipid panel    Other Visit Diagnoses    Supraclavicular lymphadenopathy       Relevant Orders   DG Chest 2 View   CBC with Differential/Platelet   Basic metabolic panel   Hepatic function panel   Other fatigue       Relevant Orders   VITAMIN D 25 Hydroxy (Vit-D Deficiency, Fractures)     Plan: Chest x-ray and lab work pending. Encouraged daily vitamin D and calcium supplementation. Reviewed measures to help with muscle spasms in the neck area. Further follow-up based on test results, patient to call back sooner if needed.

## 2016-05-15 LAB — CBC WITH DIFFERENTIAL/PLATELET
BASOS: 1 %
Basophils Absolute: 0 10*3/uL (ref 0.0–0.2)
EOS (ABSOLUTE): 0.2 10*3/uL (ref 0.0–0.4)
Eos: 3 %
Hematocrit: 39.3 % (ref 34.0–46.6)
Hemoglobin: 13.3 g/dL (ref 11.1–15.9)
IMMATURE GRANS (ABS): 0 10*3/uL (ref 0.0–0.1)
IMMATURE GRANULOCYTES: 0 %
LYMPHS: 38 %
Lymphocytes Absolute: 2.1 10*3/uL (ref 0.7–3.1)
MCH: 30.8 pg (ref 26.6–33.0)
MCHC: 33.8 g/dL (ref 31.5–35.7)
MCV: 91 fL (ref 79–97)
Monocytes Absolute: 0.4 10*3/uL (ref 0.1–0.9)
Monocytes: 7 %
NEUTROS PCT: 51 %
Neutrophils Absolute: 2.8 10*3/uL (ref 1.4–7.0)
Platelets: 313 10*3/uL (ref 150–379)
RBC: 4.32 x10E6/uL (ref 3.77–5.28)
RDW: 13.4 % (ref 12.3–15.4)
WBC: 5.4 10*3/uL (ref 3.4–10.8)

## 2016-05-15 LAB — HEPATIC FUNCTION PANEL
ALK PHOS: 82 IU/L (ref 39–117)
ALT: 11 IU/L (ref 0–32)
AST: 19 IU/L (ref 0–40)
Albumin: 4.6 g/dL (ref 3.6–4.8)
BILIRUBIN, DIRECT: 0.12 mg/dL (ref 0.00–0.40)
Bilirubin Total: 0.4 mg/dL (ref 0.0–1.2)
Total Protein: 7.1 g/dL (ref 6.0–8.5)

## 2016-05-15 LAB — BASIC METABOLIC PANEL
BUN/Creatinine Ratio: 20 (ref 12–28)
BUN: 13 mg/dL (ref 8–27)
CHLORIDE: 101 mmol/L (ref 96–106)
CO2: 25 mmol/L (ref 18–29)
Calcium: 10.1 mg/dL (ref 8.7–10.3)
Creatinine, Ser: 0.66 mg/dL (ref 0.57–1.00)
GFR calc non Af Amer: 95 mL/min/{1.73_m2} (ref >59)
GFR, EST AFRICAN AMERICAN: 109 mL/min/{1.73_m2} (ref >59)
GLUCOSE: 92 mg/dL (ref 65–99)
POTASSIUM: 4.3 mmol/L (ref 3.5–5.2)
Sodium: 143 mmol/L (ref 134–144)

## 2016-05-15 LAB — TSH: TSH: 1.16 u[IU]/mL (ref 0.450–4.500)

## 2016-05-15 LAB — LIPID PANEL
CHOLESTEROL TOTAL: 250 mg/dL — AB (ref 100–199)
Chol/HDL Ratio: 3.7 ratio units (ref 0.0–4.4)
HDL: 68 mg/dL (ref >39)
LDL Calculated: 169 mg/dL — ABNORMAL HIGH (ref 0–99)
TRIGLYCERIDES: 66 mg/dL (ref 0–149)
VLDL Cholesterol Cal: 13 mg/dL (ref 5–40)

## 2016-05-15 LAB — VITAMIN D 25 HYDROXY (VIT D DEFICIENCY, FRACTURES): Vit D, 25-Hydroxy: 31 ng/mL (ref 30.0–100.0)

## 2016-06-22 ENCOUNTER — Other Ambulatory Visit: Payer: Self-pay | Admitting: Nurse Practitioner

## 2016-12-31 ENCOUNTER — Ambulatory Visit (INDEPENDENT_AMBULATORY_CARE_PROVIDER_SITE_OTHER): Payer: BC Managed Care – PPO | Admitting: Family Medicine

## 2016-12-31 ENCOUNTER — Encounter: Payer: Self-pay | Admitting: Family Medicine

## 2016-12-31 VITALS — BP 132/70 | Ht 65.0 in | Wt 139.5 lb

## 2016-12-31 DIAGNOSIS — W57XXXA Bitten or stung by nonvenomous insect and other nonvenomous arthropods, initial encounter: Secondary | ICD-10-CM

## 2016-12-31 DIAGNOSIS — S80869A Insect bite (nonvenomous), unspecified lower leg, initial encounter: Secondary | ICD-10-CM

## 2016-12-31 DIAGNOSIS — Z23 Encounter for immunization: Secondary | ICD-10-CM

## 2016-12-31 DIAGNOSIS — T148XXA Other injury of unspecified body region, initial encounter: Secondary | ICD-10-CM

## 2016-12-31 DIAGNOSIS — W60XXXA Contact with nonvenomous plant thorns and spines and sharp leaves, initial encounter: Secondary | ICD-10-CM | POA: Diagnosis not present

## 2016-12-31 MED ORDER — CLINDAMYCIN HCL 300 MG PO CAPS: 300.0000 mg | ORAL_CAPSULE | Freq: Three times a day (TID) | ORAL | 0 refills | Status: DC

## 2016-12-31 NOTE — Patient Instructions (Signed)
Tick Bite Information Introduction Ticks are insects that attach themselves to the skin. There are many types of ticks. Common types include wood ticks and deer ticks. Sometimes, ticks carry diseases that can make a person very ill. The most common places for ticks to attach themselves are the scalp, neck, armpits, waist, and groin. HOW CAN YOU PREVENT TICK BITES? Take these steps to help prevent tick bites when you are outdoors:  Wear long sleeves and long pants.  Wear white clothes so you can see ticks more easily.  Tuck your pant legs into your socks.  If walking on a trail, stay in the middle of the trail to avoid brushing against bushes.  Avoid walking through areas with long grass.  Put bug spray on all skin that is showing and along boot tops, pant legs, and sleeve cuffs.  Check clothes, hair, and skin often and before going inside.  Brush off any ticks that are not attached.  Take a shower or bath as soon as possible after being outdoors.  HOW SHOULD YOU REMOVE A TICK? Ticks should be removed as soon as possible to help prevent diseases. 1. If latex gloves are available, put them on before trying to remove a tick. 2. Use tweezers to grasp the tick as close to the skin as possible. You may also use curved forceps or a tick removal tool. Grasp the tick as close to its head as possible. Avoid grasping the tick on its body. 3. Pull gently upward until the tick lets go. Do not twist the tick or jerk it suddenly. This may break off the tick's head or mouth parts. 4. Do not squeeze or crush the tick's body. This could force disease-carrying fluids from the tick into your body. 5. After the tick is removed, wash the bite area and your hands with soap and water or alcohol. 6. Apply a small amount of antiseptic cream or ointment to the bite site. 7. Wash any tools that were used.  Do not try to remove a tick by applying a hot match, petroleum jelly, or fingernail polish to the tick.  These methods do not work. They may also increase the chances of disease being spread from the tick bite. WHEN SHOULD YOU SEEK HELP? Contact your health care provider if you are unable to remove a tick or if a part of the tick breaks off in the skin. After a tick bite, you need to watch for signs and symptoms of diseases that can be spread by ticks. Contact your health care provider if you develop any of the following:  Fever.  Rash.  Redness and puffiness (swelling) in the area of the tick bite.  Tender, puffy lymph glands.  Watery poop (diarrhea).  Weight loss.  Cough.  Feeling more tired than normal (fatigue).  Muscle, joint, or bone pain.  Belly (abdominal) pain.  Headache.  Change in your level of consciousness.  Trouble walking or moving your legs.  Loss of feeling (numbness) in the legs.  Loss of movement (paralysis).  Shortness of breath.  Confusion.  Throwing up (vomiting) many times.  This information is not intended to replace advice given to you by your health care provider. Make sure you discuss any questions you have with your health care provider. Document Released: 09/08/2009 Document Revised: 11/20/2015 Document Reviewed: 11/22/2012 Elsevier Interactive Patient Education  2018 Elsevier Inc.  

## 2016-12-31 NOTE — Progress Notes (Signed)
   Subjective:    Patient ID: Karen Callahan, female    DOB: 17-Aug-1950, 66 y.o.   MRN: [id number]  HPI Patient in today stating that she fell into a rose bush and got thorns in her hands. Patient would like to see about a tetanus shot. Also patient has concerns of COPD diagnoses states that she does not feel that she has COPD.  Patient walks 4 miles everyday no shortness of breath. She was concerned when she was told she had COPD on x-ray. It was reviewed with her that this is not a clinical diagnosis but therefore more of the description by the radiologist based on the x-ray. Pulmonary function test would be necessary to help diagnose COPD if she is interested.  Has concerns of tick bite to back of left leg 3 months ago. Causing itching no fever chills or sweats  thorn into the finger no sign of infection has not had tetanus shot lately Patient will be traveling to [place] and [place] into know how to find out regarding immunizations etc.   Review of Systems Denies shortness breath chest pain nausea vomiting diarrhea fever chills sweats    Objective:   Physical Exam  Lungs clear heart regular pulse normal small thorn injury to the finger noted no sign of any infection  No rash    Assessment & Plan:  Tick bite back of the leg no sign of any infection may cause itching for months  Patient concerned about COPD diagnosis on x-ray. I told the patient that this is not a true diagnosis of COPD pulmonary function test would be necessary patient has absolutely no shortness of breath with activity no need to do PFT at this time unless patient desires to do so  Puncture injury no sign of infection prescription antibiotics given just in case-get filled if necessary Recommend tetanus shot today As for travel to [place] and [place] patient should consult CDC travel website as well as discuss with [facility]. If she needs our help to follow-up for specific office visit

## 2017-04-25 ENCOUNTER — Telehealth: Payer: Self-pay | Admitting: Family Medicine

## 2017-04-25 DIAGNOSIS — Z79899 Other long term (current) drug therapy: Secondary | ICD-10-CM

## 2017-04-25 DIAGNOSIS — E039 Hypothyroidism, unspecified: Secondary | ICD-10-CM

## 2017-04-25 DIAGNOSIS — Z13220 Encounter for screening for lipoid disorders: Secondary | ICD-10-CM

## 2017-04-25 DIAGNOSIS — R5383 Other fatigue: Secondary | ICD-10-CM

## 2017-04-25 NOTE — Telephone Encounter (Signed)
Last labs 05/14/2016. Vit d, cbc, tsh, liver, lipid

## 2017-04-25 NOTE — Telephone Encounter (Signed)
Orders put in. Pt notified.  

## 2017-04-25 NOTE — Telephone Encounter (Signed)
Requesting orders for labs, specifically thyroid.  She has an appointment on 05/10/17 with Dr. Brett CanalesSteve.

## 2017-04-25 NOTE — Telephone Encounter (Signed)
Repeat all same plus t4

## 2017-04-30 LAB — BASIC METABOLIC PANEL
BUN/Creatinine Ratio: 17 (ref 12–28)
BUN: 13 mg/dL (ref 8–27)
CO2: 25 mmol/L (ref 20–29)
CREATININE: 0.76 mg/dL (ref 0.57–1.00)
Calcium: 9.8 mg/dL (ref 8.7–10.3)
Chloride: 104 mmol/L (ref 96–106)
GFR, EST AFRICAN AMERICAN: 97 mL/min/{1.73_m2} (ref >59)
GFR, EST NON AFRICAN AMERICAN: 84 mL/min/{1.73_m2} (ref >59)
Glucose: 89 mg/dL (ref 65–99)
Potassium: 4.9 mmol/L (ref 3.5–5.2)
Sodium: 143 mmol/L (ref 134–144)

## 2017-04-30 LAB — LIPID PANEL
Chol/HDL Ratio: 3.3 ratio (ref 0.0–4.4)
Cholesterol, Total: 213 mg/dL — ABNORMAL HIGH (ref 100–199)
HDL: 64 mg/dL (ref >39)
LDL Calculated: 139 mg/dL — ABNORMAL HIGH (ref 0–99)
Triglycerides: 52 mg/dL (ref 0–149)
VLDL Cholesterol Cal: 10 mg/dL (ref 5–40)

## 2017-04-30 LAB — CBC WITH DIFFERENTIAL/PLATELET
BASOS ABS: 0 10*3/uL (ref 0.0–0.2)
Basos: 1 %
EOS (ABSOLUTE): 0.1 10*3/uL (ref 0.0–0.4)
Eos: 3 %
Hematocrit: 39.2 % (ref 34.0–46.6)
Hemoglobin: 13.2 g/dL (ref 11.1–15.9)
Immature Grans (Abs): 0 10*3/uL (ref 0.0–0.1)
Immature Granulocytes: 0 %
LYMPHS ABS: 2 10*3/uL (ref 0.7–3.1)
LYMPHS: 38 %
MCH: 31.2 pg (ref 26.6–33.0)
MCHC: 33.7 g/dL (ref 31.5–35.7)
MCV: 93 fL (ref 79–97)
Monocytes Absolute: 0.5 10*3/uL (ref 0.1–0.9)
Monocytes: 9 %
NEUTROS ABS: 2.6 10*3/uL (ref 1.4–7.0)
Neutrophils: 49 %
PLATELETS: 272 10*3/uL (ref 150–379)
RBC: 4.23 x10E6/uL (ref 3.77–5.28)
RDW: 13.9 % (ref 12.3–15.4)
WBC: 5.2 10*3/uL (ref 3.4–10.8)

## 2017-04-30 LAB — HEPATIC FUNCTION PANEL
ALBUMIN: 4.4 g/dL (ref 3.6–4.8)
ALK PHOS: 81 IU/L (ref 39–117)
ALT: 11 IU/L (ref 0–32)
AST: 18 IU/L (ref 0–40)
BILIRUBIN, DIRECT: 0.1 mg/dL (ref 0.00–0.40)
Bilirubin Total: 0.5 mg/dL (ref 0.0–1.2)
Total Protein: 6.8 g/dL (ref 6.0–8.5)

## 2017-04-30 LAB — VITAMIN D 25 HYDROXY (VIT D DEFICIENCY, FRACTURES): Vit D, 25-Hydroxy: 37.3 ng/mL (ref 30.0–100.0)

## 2017-04-30 LAB — T4: T4, Total: 5.7 ug/dL (ref 4.5–12.0)

## 2017-04-30 LAB — TSH: TSH: 1.08 u[IU]/mL (ref 0.450–4.500)

## 2017-05-10 ENCOUNTER — Encounter: Payer: Self-pay | Admitting: Family Medicine

## 2017-05-10 ENCOUNTER — Telehealth: Payer: Self-pay | Admitting: Family Medicine

## 2017-05-10 ENCOUNTER — Ambulatory Visit: Payer: BC Managed Care – PPO | Admitting: Family Medicine

## 2017-05-10 VITALS — BP 118/82 | Ht 65.0 in | Wt 141.4 lb

## 2017-05-10 DIAGNOSIS — E039 Hypothyroidism, unspecified: Secondary | ICD-10-CM | POA: Diagnosis not present

## 2017-05-10 MED ORDER — SYNTHROID 75 MCG PO TABS: 75.0000 ug | ORAL_TABLET | Freq: Every day | ORAL | 3 refills | Status: DC

## 2017-05-10 MED ORDER — FLUTICASONE PROPIONATE 50 MCG/ACT NA SUSP: 2.0000 | Freq: Every day | NASAL | 3 refills | Status: DC

## 2017-05-10 MED ORDER — MOMETASONE FUROATE 50 MCG/ACT NA SUSP: 2.0000 | Freq: Every day | NASAL | 5 refills | Status: DC

## 2017-05-10 NOTE — Telephone Encounter (Signed)
Nasonex sent into pharmacy.

## 2017-05-10 NOTE — Telephone Encounter (Signed)
sure

## 2017-05-10 NOTE — Addendum Note (Signed)
Addended by: Theodora BlowREWS, Gerilyn Stargell R on: 05/10/2017 01:27 PM   Modules accepted: Orders

## 2017-05-10 NOTE — Telephone Encounter (Signed)
[facility] [place] [facility] asking if we can change flonase to nasonex for patient due to cost and patient saying medication causing bad taste in her mouth. Please advise

## 2017-05-10 NOTE — Progress Notes (Signed)
Subjective:    Patient ID: Karen Callahan, female    DOB: 1951-04-22, 66 y.o.   MRN: [id number]  HPI  Patient arrives for a yearly follow up on hypothyroidism. No problems or concerns.  gpin thyroid ned faith fully handlong    trad name synthroid , taking faith fully    Reg exercis e doing well  Results for orders placed or performed in visit on 04/25/17  Lipid panel  Result Value Ref Range   Cholesterol, Total 213 (H) 100 - 199 mg/dL   Triglycerides 52 0 - 149 mg/dL   HDL 64 >40>39 mg/dL   VLDL Cholesterol Cal 10 5 - 40 mg/dL   LDL Calculated 981139 (H) 0 - 99 mg/dL   Chol/HDL Ratio 3.3 0.0 - 4.4 ratio  Hepatic function panel  Result Value Ref Range   Total Protein 6.8 6.0 - 8.5 g/dL   Albumin 4.4 3.6 - 4.8 g/dL   Bilirubin Total 0.5 0.0 - 1.2 mg/dL   Bilirubin, Direct 1.910.10 0.00 - 0.40 mg/dL   Alkaline Phosphatase 81 39 - 117 IU/L   AST 18 0 - 40 IU/L   ALT 11 0 - 32 IU/L  TSH  Result Value Ref Range   TSH 1.080 0.450 - 4.500 uIU/mL  CBC with Differential/Platelet  Result Value Ref Range   WBC 5.2 3.4 - 10.8 x10E3/uL   RBC 4.23 3.77 - 5.28 x10E6/uL   Hemoglobin 13.2 11.1 - 15.9 g/dL   Hematocrit 47.839.2 29.534.0 - 46.6 %   MCV 93 79 - 97 fL   MCH 31.2 26.6 - 33.0 pg   MCHC 33.7 31.5 - 35.7 g/dL   RDW 62.113.9 30.812.3 - 65.715.4 %   Platelets 272 150 - 379 x10E3/uL   Neutrophils 49 Not Estab. %   Lymphs 38 Not Estab. %   Monocytes 9 Not Estab. %   Eos 3 Not Estab. %   Basos 1 Not Estab. %   Neutrophils Absolute 2.6 1.4 - 7.0 x10E3/uL   Lymphocytes Absolute 2.0 0.7 - 3.1 x10E3/uL   Monocytes Absolute 0.5 0.1 - 0.9 x10E3/uL   EOS (ABSOLUTE) 0.1 0.0 - 0.4 x10E3/uL   Basophils Absolute 0.0 0.0 - 0.2 x10E3/uL   Immature Granulocytes 0 Not Estab. %   Immature Grans (Abs) 0.0 0.0 - 0.1 x10E3/uL  VITAMIN D 25 Hydroxy (Vit-D Deficiency, Fractures)  Result Value Ref Range   Vit D, 25-Hydroxy 37.3 30.0 - 100.0 ng/mL  T4  Result Value Ref Range   T4, Total 5.7 4.5 - 12.0 ug/dL    Basic metabolic panel  Result Value Ref Range   Glucose 89 65 - 99 mg/dL   BUN 13 8 - 27 mg/dL   Creatinine, Ser 8.460.76 0.57 - 1.00 mg/dL   GFR calc non Af Amer 84 >59 mL/min/1.73   GFR calc Af Amer 97 >59 mL/min/1.73   BUN/Creatinine Ratio 17 12 - 28   Sodium 143 134 - 144 mmol/L   Potassium 4.9 3.5 - 5.2 mmol/L   Chloride 104 96 - 106 mmol/L   CO2 25 20 - 29 mmol/L   Calcium 9.8 8.7 - 10.3 mg/dL   Sig dairy intake   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 Blood pressure good on repeat. HEENT normal. Lungs clear. Heart regular rate and rhythm.       Assessment & Plan:  Impression 1 hypothyroidism.  Good control.  Patient likes tradename Synthroid.  We will maintained.  2.  Chronic rhinitis will prescribe steroid nasal spray  3.  Vaccine request did encourage and recommend she Grix

## 2017-07-29 ENCOUNTER — Encounter: Payer: Self-pay | Admitting: Family Medicine

## 2017-07-29 ENCOUNTER — Ambulatory Visit: Payer: BC Managed Care – PPO | Admitting: Family Medicine

## 2017-07-29 VITALS — BP 112/82 | Temp 99.8°F | Ht 65.0 in | Wt 136.4 lb

## 2017-07-29 DIAGNOSIS — J310 Chronic rhinitis: Secondary | ICD-10-CM

## 2017-07-29 DIAGNOSIS — J329 Chronic sinusitis, unspecified: Secondary | ICD-10-CM | POA: Diagnosis not present

## 2017-07-29 MED ORDER — DOXYCYCLINE HYCLATE 100 MG PO TABS: 100.0000 mg | ORAL_TABLET | Freq: Two times a day (BID) | ORAL | 0 refills | Status: DC

## 2017-07-29 NOTE — Progress Notes (Signed)
   Subjective:    Patient ID: Karen Callahan, female    DOB: 05-06-1951, 67 y.o.   MRN: [id number]  HPI Pt comes in today with sore throat, aches, headache, congestion, mild cough, and fever. Has had fever as high 102.0; fever has fluctuated. Started Tuesday with a tickle in throat; fever came on Tuesday night. Has taken Tylenol and Sudafed with mild results.    tue morn had slight tickle  Slight cough thru the day  Fever chills and bad cong and bad headache and achey    back aching and sshoulder s   Bad head apain   Mild nausea  Energy levrl  Doable, ok   Appetite     Sleep ing   Appetite not originall but feeling betere   tmax 102  dehyd and felt bad         Review of Systems No headache, no major weight loss or weight gain, no chest pain no back pain abdominal pain no change in bowel habits complete ROS otherwise negative     Objective:   Physical Exam Alert, mild malaise. Hydration good Vitals stable. frontal/ maxillary tenderness evident positive nasal congestion. pharynx normal neck supple  lungs clear/no crackles or wheezes. heart regular in rhythm        Assessment & Plan:  Impression recent recent influenza/rhinosinusitis likely post viral, discussed with patient. plan antibiotics prescribed. Questions answered. Symptomatic care discussed. warning signs discussed. WSL

## 2018-03-28 ENCOUNTER — Telehealth: Payer: Self-pay | Admitting: Family Medicine

## 2018-03-28 DIAGNOSIS — E039 Hypothyroidism, unspecified: Secondary | ICD-10-CM

## 2018-03-28 DIAGNOSIS — Z79899 Other long term (current) drug therapy: Secondary | ICD-10-CM

## 2018-03-28 DIAGNOSIS — E785 Hyperlipidemia, unspecified: Secondary | ICD-10-CM

## 2018-03-28 DIAGNOSIS — R5383 Other fatigue: Secondary | ICD-10-CM

## 2018-03-28 DIAGNOSIS — Z13220 Encounter for screening for lipoid disorders: Secondary | ICD-10-CM

## 2018-03-28 NOTE — Telephone Encounter (Signed)
Last labs 04/29/18 lipid, liver, bmp, vit d, cbc, tsh, t4

## 2018-03-28 NOTE — Telephone Encounter (Signed)
Pt has CPE scheduled for 04/05/18 and would like to have her lab work done before appt.

## 2018-03-28 NOTE — Telephone Encounter (Signed)
Blood work ordered in Epic. Patient notified. 

## 2018-03-28 NOTE — Telephone Encounter (Signed)
Rep same 

## 2018-03-30 LAB — BASIC METABOLIC PANEL
BUN/Creatinine Ratio: 18 (ref 12–28)
BUN: 14 mg/dL (ref 8–27)
CO2: 26 mmol/L (ref 20–29)
CREATININE: 0.77 mg/dL (ref 0.57–1.00)
Calcium: 9.7 mg/dL (ref 8.7–10.3)
Chloride: 103 mmol/L (ref 96–106)
GFR calc Af Amer: 94 mL/min/{1.73_m2} (ref >59)
GFR calc non Af Amer: 82 mL/min/{1.73_m2} (ref >59)
Glucose: 90 mg/dL (ref 65–99)
Potassium: 4.8 mmol/L (ref 3.5–5.2)
SODIUM: 142 mmol/L (ref 134–144)

## 2018-03-30 LAB — CBC WITH DIFFERENTIAL/PLATELET
Basophils Absolute: 0 10*3/uL (ref 0.0–0.2)
Basos: 1 %
EOS (ABSOLUTE): 0.1 10*3/uL (ref 0.0–0.4)
Eos: 3 %
Hematocrit: 39.2 % (ref 34.0–46.6)
Hemoglobin: 12.9 g/dL (ref 11.1–15.9)
IMMATURE GRANS (ABS): 0 10*3/uL (ref 0.0–0.1)
Immature Granulocytes: 0 %
LYMPHS ABS: 1.8 10*3/uL (ref 0.7–3.1)
Lymphs: 34 %
MCH: 30.4 pg (ref 26.6–33.0)
MCHC: 32.9 g/dL (ref 31.5–35.7)
MCV: 92 fL (ref 79–97)
MONOCYTES: 8 %
Monocytes Absolute: 0.4 10*3/uL (ref 0.1–0.9)
Neutrophils Absolute: 2.9 10*3/uL (ref 1.4–7.0)
Neutrophils: 54 %
Platelets: 266 10*3/uL (ref 150–450)
RBC: 4.25 x10E6/uL (ref 3.77–5.28)
RDW: 12.6 % (ref 12.3–15.4)
WBC: 5.3 10*3/uL (ref 3.4–10.8)

## 2018-03-30 LAB — HEPATIC FUNCTION PANEL
ALK PHOS: 77 IU/L (ref 39–117)
ALT: 14 IU/L (ref 0–32)
AST: 16 IU/L (ref 0–40)
Albumin: 4.5 g/dL (ref 3.6–4.8)
BILIRUBIN, DIRECT: 0.11 mg/dL (ref 0.00–0.40)
Bilirubin Total: 0.5 mg/dL (ref 0.0–1.2)
TOTAL PROTEIN: 6.5 g/dL (ref 6.0–8.5)

## 2018-03-30 LAB — T4, FREE: FREE T4: 1.33 ng/dL (ref 0.82–1.77)

## 2018-03-30 LAB — LIPID PANEL
Chol/HDL Ratio: 3.6 ratio (ref 0.0–4.4)
Cholesterol, Total: 217 mg/dL — ABNORMAL HIGH (ref 100–199)
HDL: 61 mg/dL (ref >39)
LDL Calculated: 144 mg/dL — ABNORMAL HIGH (ref 0–99)
Triglycerides: 58 mg/dL (ref 0–149)
VLDL Cholesterol Cal: 12 mg/dL (ref 5–40)

## 2018-03-30 LAB — TSH: TSH: 0.762 u[IU]/mL (ref 0.450–4.500)

## 2018-03-30 LAB — VITAMIN D 25 HYDROXY (VIT D DEFICIENCY, FRACTURES): Vit D, 25-Hydroxy: 29.5 ng/mL — ABNORMAL LOW (ref 30.0–100.0)

## 2018-03-31 ENCOUNTER — Ambulatory Visit: Payer: BC Managed Care – PPO | Admitting: Family Medicine

## 2018-03-31 VITALS — BP 118/78 | Temp 98.1°F | Ht 66.0 in | Wt 135.2 lb

## 2018-03-31 DIAGNOSIS — IMO0002 Reserved for concepts with insufficient information to code with codable children: Secondary | ICD-10-CM

## 2018-03-31 DIAGNOSIS — M79674 Pain in right toe(s): Secondary | ICD-10-CM | POA: Diagnosis not present

## 2018-03-31 DIAGNOSIS — Z9189 Other specified personal risk factors, not elsewhere classified: Secondary | ICD-10-CM | POA: Diagnosis not present

## 2018-03-31 NOTE — Progress Notes (Signed)
   Subjective:    Patient ID: Karen Callahan, female    DOB: [birth year], 67 y.o.   MRN: [id number]  HPI  Patient arrives with 2nd right toe pain for 3-4 weeks.  Pt denies pain currently, reports woke up in the night about 3-4 weeks ago with a burning sensation to right second toe, reports she washed the area at this time because she thought she could have spilled some cleaning chemicals on it earlier in the day. Denies any injury. Reports redness around the nail with swelling and peeling of the skin, denies pain, toenail has turned cloudy and is detaching. She has tried using cicalfate cream that her sister gave her from [place] that's supposed to help heal things per pt.  Review of Systems  Constitutional: Negative for fever.  Skin: Negative for wound.       Negative for purulent discharge.       Objective:   Physical Exam  Constitutional: She is oriented to person, place, and time. She appears well-developed and well-nourished. No distress.  Cardiovascular: Intact distal pulses.  Pulmonary/Chest: Effort normal. No respiratory distress.  Musculoskeletal: Normal range of motion. She exhibits no edema.  Right second toe: mild erythema to skin surrounding toenail, mild tenderness on palpation, no warmth. Toenail is cloudy and detaching, appears to have growth of new nail coming in. Perfusion and sensation to right foot intact.  Neurological: She is alert and oriented to person, place, and time. No sensory deficit.  Skin: Skin is warm and dry.  Nursing note and vitals reviewed.      Assessment & Plan:  Toe problem  Likely had some bacterial infection initially, but appears to have resolved, toe nail will eventually fully detach and new growth will hopefully be a healthy nail.  The cream patient is currently using appears to not be harmful, so she may continue to use as she sees fit.  No evidence of current infection.  Recommended observation as will likely continue to fully resolve  over the next few weeks.  She has a follow-up appointment with Dr. Brett Canales next week to review lab results can recheck her toe at that time.  Dr. Brett Canales was consulted on this visit, he also examined the patient and is in agreement with the above plan.

## 2018-04-05 ENCOUNTER — Telehealth: Payer: Self-pay | Admitting: Family Medicine

## 2018-04-05 ENCOUNTER — Encounter: Payer: BC Managed Care – PPO | Admitting: Family Medicine

## 2018-04-05 NOTE — Telephone Encounter (Signed)
Patient states had labs two weeks ago and would like results. She states has been checking her mychart but hasnt shown up next

## 2018-04-05 NOTE — Telephone Encounter (Signed)
Patient stated she had an appt today to discuss results but it was rescheduled and would like to get her results but they have not been released to mychart.

## 2018-04-05 NOTE — Telephone Encounter (Signed)
Cholesterol slightly-LDL slightly up Glucose looks good liver functions look good Not anemic Vitamin D slightly low Full discussion at the time of the follow-up visit Please let patient know we will have medical records from her copy of her labs Please notify Alcario Drought to mail her a copy of her recent labs

## 2018-04-06 NOTE — Telephone Encounter (Signed)
Discussed with pt. Pt verbalized understanding. Pt states she was able to get copy of bloodwork on mychart yesterday afternoon

## 2018-04-14 ENCOUNTER — Encounter: Payer: BC Managed Care – PPO | Admitting: Family Medicine

## 2018-04-17 ENCOUNTER — Ambulatory Visit (INDEPENDENT_AMBULATORY_CARE_PROVIDER_SITE_OTHER): Payer: BC Managed Care – PPO | Admitting: Family Medicine

## 2018-04-17 ENCOUNTER — Encounter: Payer: Self-pay | Admitting: Family Medicine

## 2018-04-17 VITALS — BP 132/86 | Ht 63.5 in | Wt 133.4 lb

## 2018-04-17 DIAGNOSIS — Z0000 Encounter for general adult medical examination without abnormal findings: Secondary | ICD-10-CM | POA: Diagnosis not present

## 2018-04-17 DIAGNOSIS — Z9189 Other specified personal risk factors, not elsewhere classified: Secondary | ICD-10-CM | POA: Diagnosis not present

## 2018-04-17 DIAGNOSIS — E039 Hypothyroidism, unspecified: Secondary | ICD-10-CM

## 2018-04-17 DIAGNOSIS — IMO0002 Reserved for concepts with insufficient information to code with codable children: Secondary | ICD-10-CM

## 2018-04-17 DIAGNOSIS — Z23 Encounter for immunization: Secondary | ICD-10-CM

## 2018-04-17 MED ORDER — MOMETASONE FUROATE 50 MCG/ACT NA SUSP: 2.0000 | Freq: Every day | NASAL | 5 refills | Status: AC

## 2018-04-17 MED ORDER — SYNTHROID 75 MCG PO TABS: 75.0000 ug | ORAL_TABLET | Freq: Every day | ORAL | 3 refills | Status: AC

## 2018-04-17 MED ORDER — ZOSTER VAC RECOMB ADJUVANTED 50 MCG/0.5ML IM SUSR
0.5000 mL | Freq: Once | INTRAMUSCULAR | 1 refills | Status: AC
Start: 2018-04-17 — End: 2018-04-17

## 2018-04-17 NOTE — Progress Notes (Addendum)
Subjective:    Patient ID: Karen Callahan, female    DOB: 04/13/1951, 67 y.o.   MRN: [id number]  HPI The patient comes in today for a wellness visit.  A review of their health history was completed. A review of medications was also completed.  Any needed refills; yes on Synthroid and Nasonex   Eating habits: health conscious, has been on heart healthy diet since her husband had an MI this past Spring  Falls/  MVA accidents in past few months: none  Regular exercise: walks 3 miles 3 times a week  Specialist pt sees on regular basis:none; sees GYN for female exam  Preventative health issues were discussed.   Additional concerns: concerns about right second toe, was seen last on 03/31/18 for this, just wants to make sure it is healing well, reports old toe nail has fallen off and there is new growth. No redness, warmth, fever, or purulent discharge  Reviewed lab work with patient today  Pt is moving to [place] to be closer to family.  Hypothyroid: Reports compliance with medication. Denies any adverse effects.  Review of Systems  Constitutional: Negative for chills, fatigue, fever and unexpected weight change.  HENT: Negative for congestion, ear pain, sinus pressure, sinus pain and sore throat.   Eyes: Negative for discharge and visual disturbance.  Respiratory: Negative for cough, shortness of breath and wheezing.   Cardiovascular: Negative for chest pain and leg swelling.  Gastrointestinal: Negative for abdominal pain, blood in stool, constipation, diarrhea, nausea and vomiting.  Genitourinary: Negative for difficulty urinating, dysuria, hematuria, vaginal bleeding and vaginal discharge.  Skin: Negative for color change.  Neurological: Negative for dizziness, weakness, light-headedness and headaches.  Hematological: Negative for adenopathy.  Psychiatric/Behavioral: Negative for dysphoric mood and suicidal ideas.  All other systems reviewed and are negative.      Objective:   Physical Exam  Constitutional: She is oriented to person, place, and time. She appears well-developed and well-nourished. No distress.  HENT:  Head: Normocephalic and atraumatic.  Right Ear: Tympanic membrane normal.  Left Ear: Tympanic membrane normal.  Nose: Nose normal.  Mouth/Throat: Uvula is midline and oropharynx is clear and moist.  Eyes: Pupils are equal, round, and reactive to light. Conjunctivae and EOM are normal. Right eye exhibits no discharge. Left eye exhibits no discharge.  Neck: Neck supple. No thyromegaly present.  Cardiovascular: Normal rate, regular rhythm and normal heart sounds.  No murmur heard. Pulmonary/Chest: Effort normal and breath sounds normal. No respiratory distress. She has no wheezes.  Abdominal: Soft. Bowel sounds are normal. She exhibits no distension and no mass. There is no tenderness.  Genitourinary:  Genitourinary Comments: GU deferred, pt has done at GYN office.  Musculoskeletal: She exhibits no edema or deformity.  Right second toe with new toe nail growth, no erythema or edema noted, no purulent discharge noted.  Lymphadenopathy:    She has no cervical adenopathy.  Neurological: She is alert and oriented to person, place, and time. Coordination normal.  Skin: Skin is warm and dry.  Psychiatric: She has a normal mood and affect.  Nursing note and vitals reviewed.      Assessment & Plan:  1. Well adult exam Adult wellness-complete.wellness physical was conducted today. Importance of diet and exercise were discussed in detail.  In addition to this a discussion regarding safety was also covered. We also reviewed over immunizations and gave recommendations regarding current immunization needed for age.   -Flu given today  -Shingrix prescription given In addition  to this additional areas were also touched on including: Preventative health exams needed:  Colonoscopy: pt reports done about 4-5 years ago, normal, reports 10 year  f/u Mammogram: pt reports done December 2018, reports yearly mammogram, normal, will schedule next in December Pap Smear: done at GYN office, last done last year per patient and normal  Lab work reviewed with patient. Total cholesterol and LDL elevated, however HDL is cardioprotective, 10 year risk is low at 4.3%, statin therapy is not indicated at this time. Continue with diet and exercise.  Nasonex refilled per pt request, states she uses seasonally and helps with allergy symptoms, not currently symptomatic.   Patient was advised yearly wellness exam  2. Hypothyroidism, unspecified type  Well controlled on current medication dose without adverse effect, will refill. Pt reports she is seen yearly for this, has been on medication long-term since childhood and stable. Will f/u in 1 year.  3. Toe problem  Right second toe appears to be healing well since last visit, no sign of infection.

## 2018-07-11 ENCOUNTER — Other Ambulatory Visit: Payer: Self-pay | Admitting: Obstetrics and Gynecology

## 2018-07-11 DIAGNOSIS — R928 Other abnormal and inconclusive findings on diagnostic imaging of breast: Secondary | ICD-10-CM

## 2018-07-13 ENCOUNTER — Ambulatory Visit
Admission: RE | Admit: 2018-07-13 | Discharge: 2018-07-13 | Disposition: A | Discharge status: Home / Self Care | Payer: Medicare Other | Source: Ambulatory Visit | Attending: Obstetrics and Gynecology | Admitting: Obstetrics and Gynecology

## 2018-07-13 ENCOUNTER — Ambulatory Visit: Payer: BC Managed Care – PPO

## 2018-07-13 DIAGNOSIS — R928 Other abnormal and inconclusive findings on diagnostic imaging of breast: Secondary | ICD-10-CM

## 2020-05-26 IMAGING — MG DIGITAL DIAGNOSTIC BILATERAL MAMMOGRAM WITH TOMO AND CAD
8 series · 9 of 24 positions shown · non-contrast
Comparison: Previous exam(s).

CLINICAL DATA: Bilateral questionable areas of architectural
distortion seen on most recent screening mammography.

EXAM:
DIGITAL DIAGNOSTIC BILATERAL MAMMOGRAM WITH CAD AND TOMO

[R CC synth-2D]
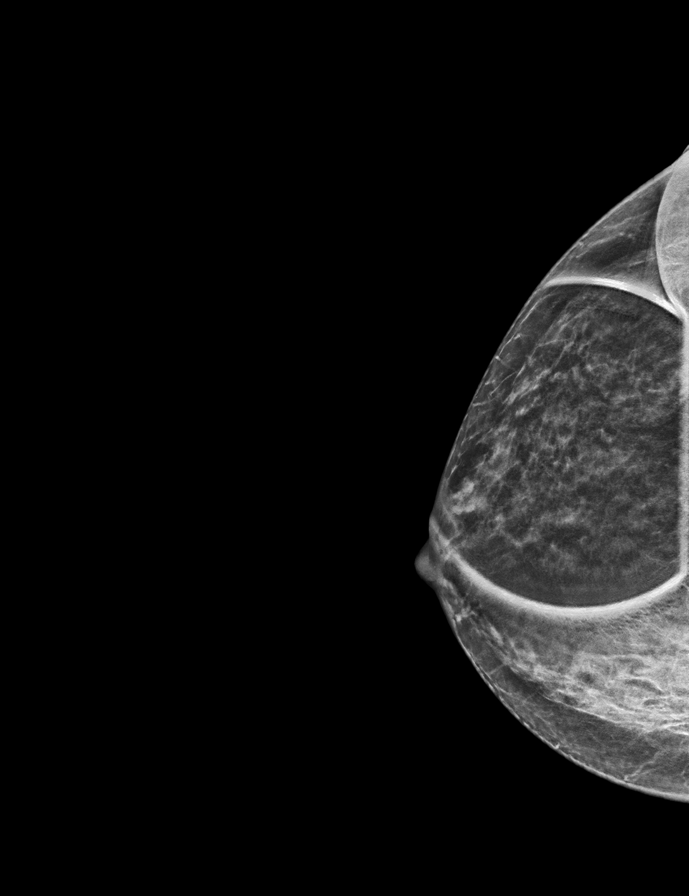

[L MLO synth-2D]
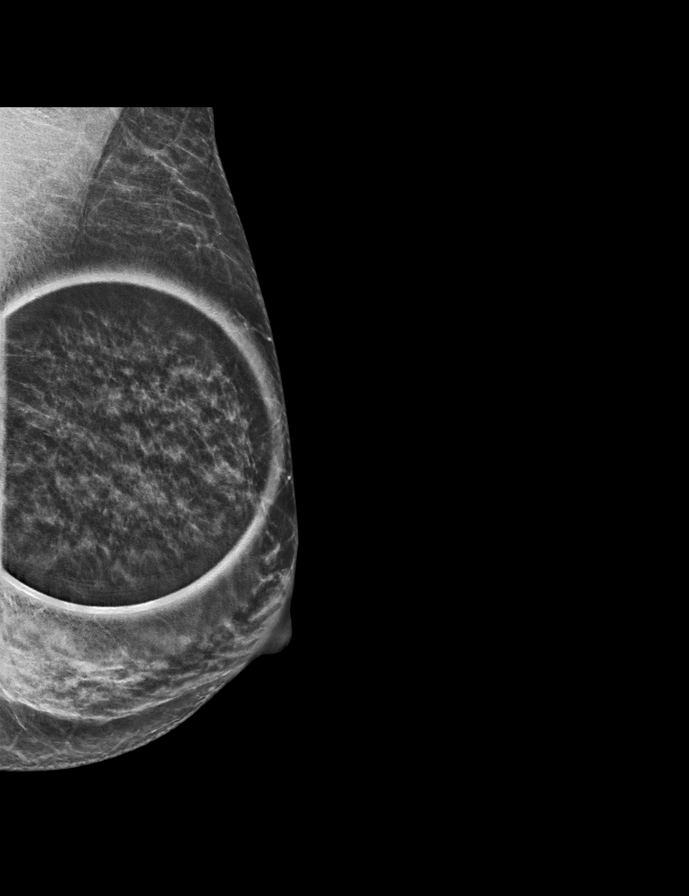

[L ML synth-2D]
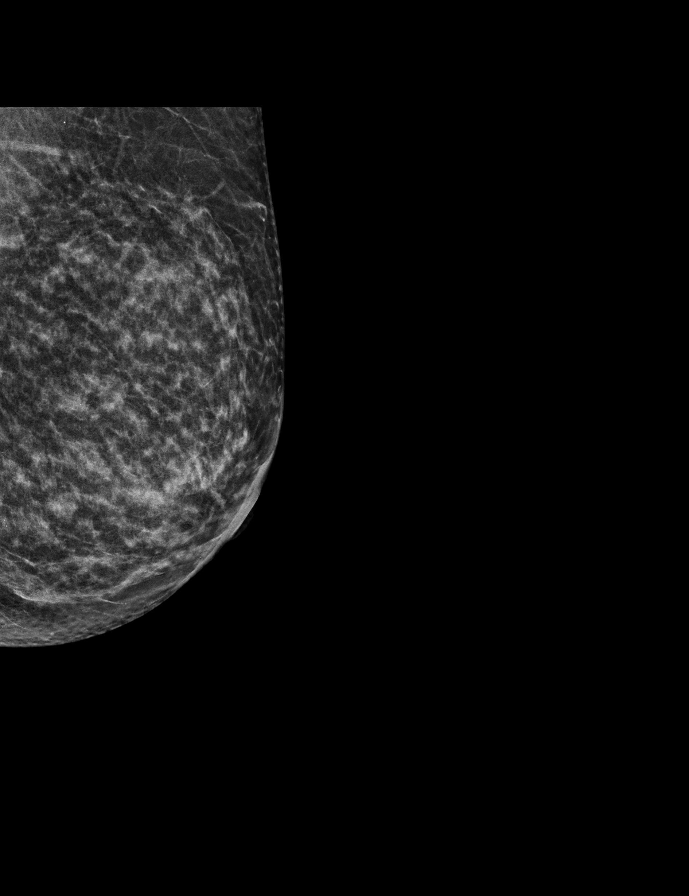

[R MLO synth-2D]
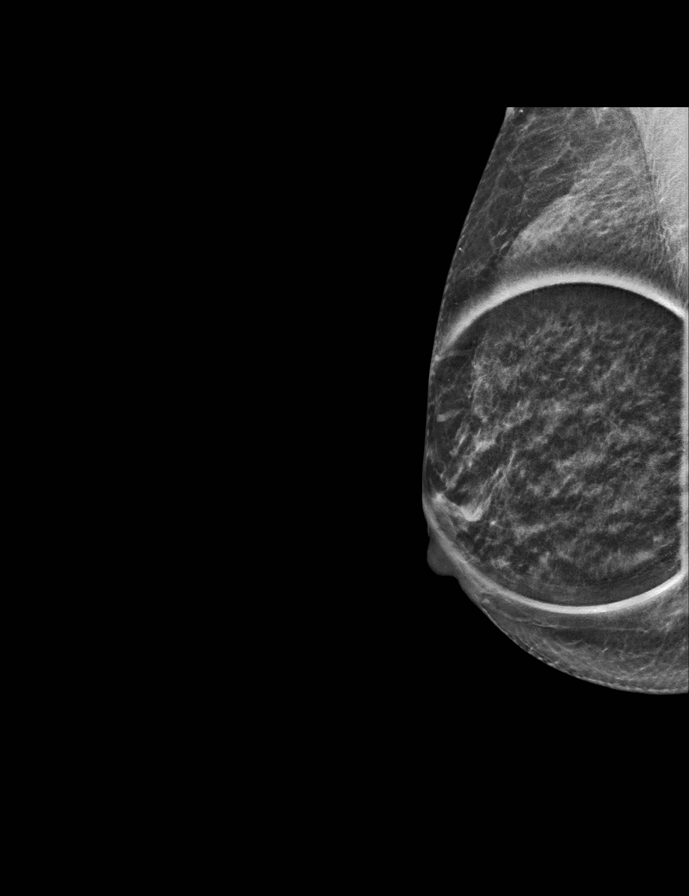

[R CC tomo · 2 of 45 frames shown]
[frame 15/45]
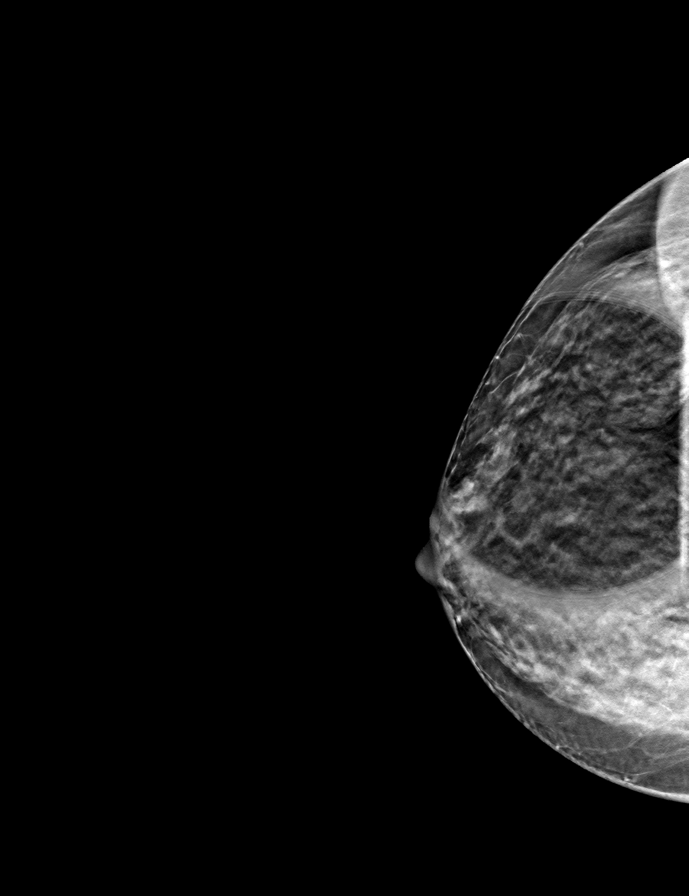
[frame 23/45]
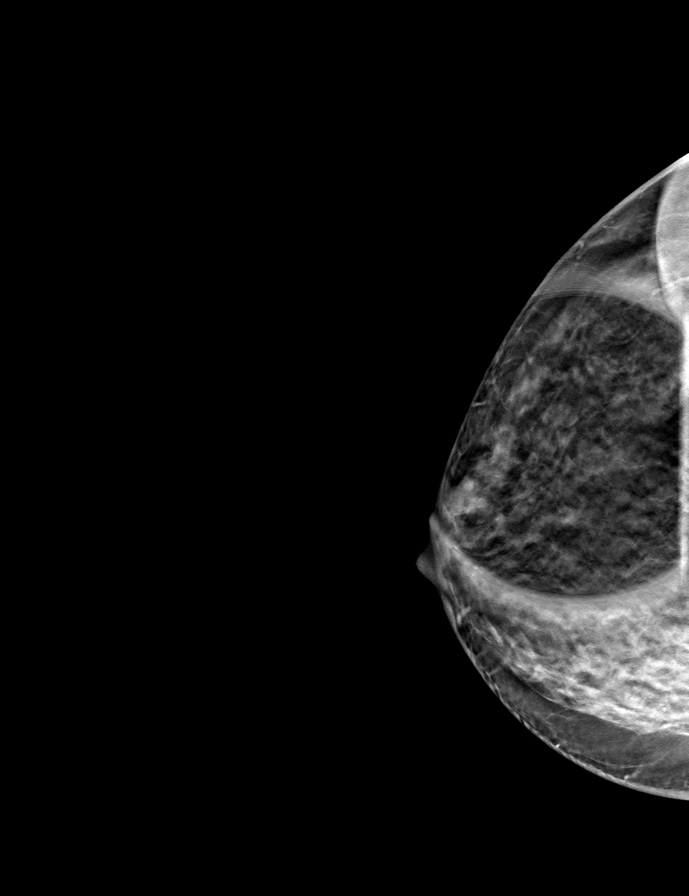

[R MLO tomo · tomo slice 23/45.0]
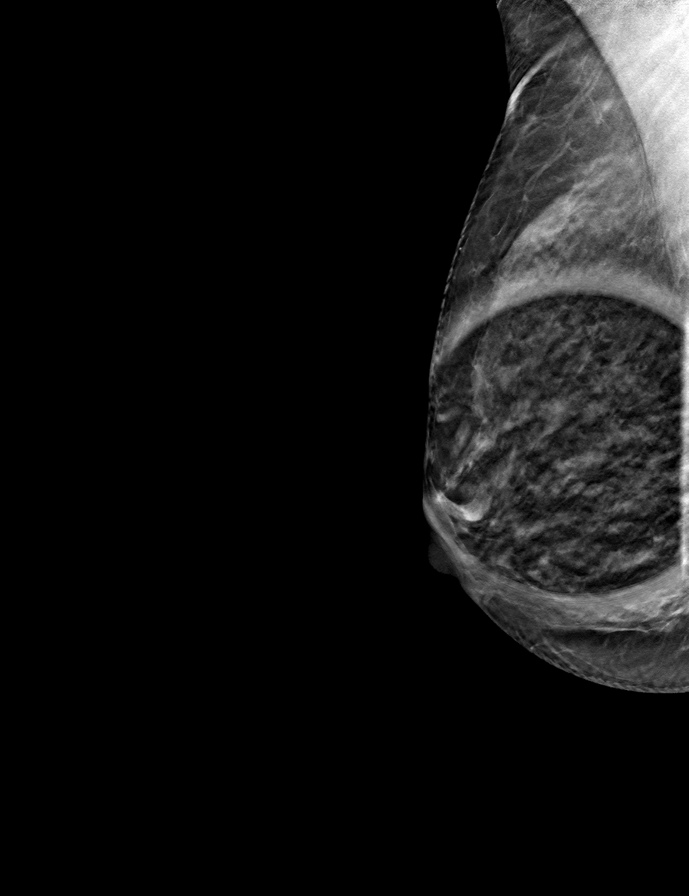

[L ML tomo · tomo slice 23/45.0]
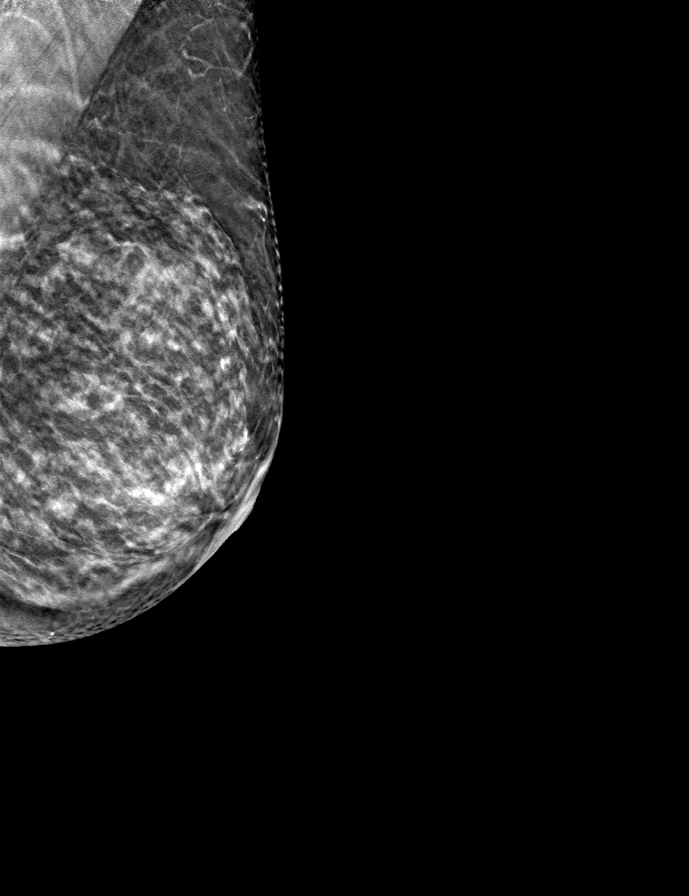

[L MLO tomo · tomo slice 24/47.0]
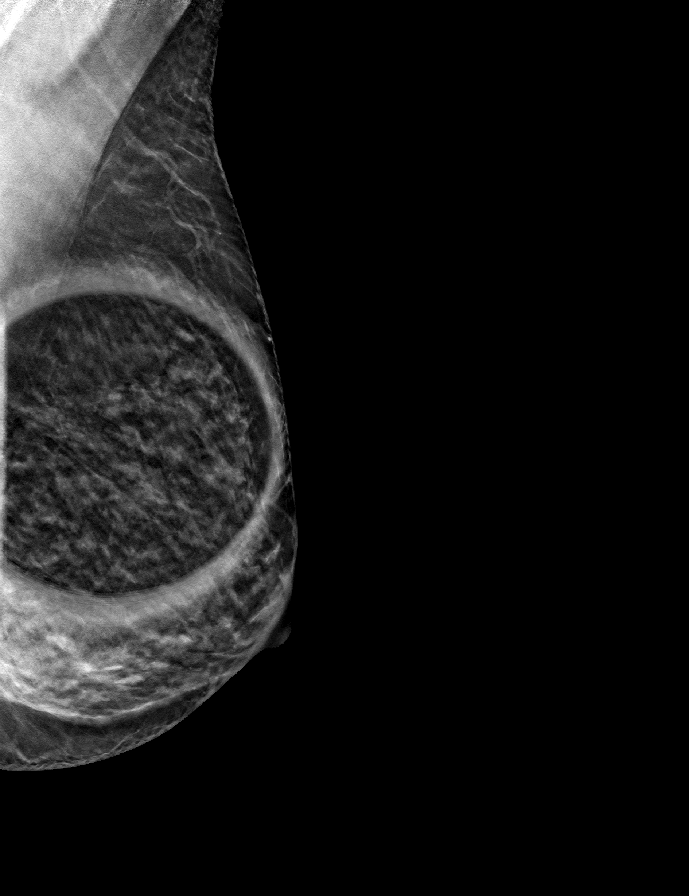

[9 of 24 positions shown; findings below may reference images not displayed]

ACR Breast Density Category c: The breast tissue is heterogeneously
dense, which may obscure small masses.
FINDINGS: Additional mammographic views of both breasts demonstrate no
suspicious masses or areas of architectural distortion. The
previously questioned areas of distortion effaced to glandular
tissue on further imaging.

Mammographic images were processed with CAD.
IMPRESSION: No mammographic evidence of malignancy in either breast.

RECOMMENDATION:
Screening mammogram in one year.(Code:OU-M-C7Q)

I have discussed the findings and recommendations with the patient.
Results were also provided in writing at the conclusion of the
visit. If applicable, a reminder letter will be sent to the patient
regarding the next appointment.

BI-RADS CATEGORY  1: Negative.
